# Patient Record
Sex: Female | Born: 1965
Health system: Southern US, Community
[De-identification: ages and names within clinical notes are randomized; demographics above are authoritative.]

## PROBLEM LIST (undated history)

## (undated) DIAGNOSIS — Z789 Other specified health status: Secondary | ICD-10-CM

## (undated) DIAGNOSIS — N3289 Other specified disorders of bladder: Secondary | ICD-10-CM

## (undated) HISTORY — PX: WISDOM TOOTH EXTRACTION: SHX21

## (undated) HISTORY — PX: TONSILLECTOMY: SUR1361

## (undated) HISTORY — PX: TOTAL ABDOMINAL HYSTERECTOMY: SHX209

---

## 1993-06-30 HISTORY — PX: TUBAL LIGATION: SHX77

## 1999-11-21 ENCOUNTER — Emergency Department (HOSPITAL_COMMUNITY): Admission: EM | Admit: 1999-11-21 | Discharge: 1999-11-21 | Payer: Self-pay | Admitting: Internal Medicine

## 2000-12-19 ENCOUNTER — Emergency Department (HOSPITAL_COMMUNITY): Admission: EM | Admit: 2000-12-19 | Discharge: 2000-12-19 | Payer: Self-pay

## 2000-12-23 ENCOUNTER — Emergency Department (HOSPITAL_COMMUNITY): Admission: EM | Admit: 2000-12-23 | Discharge: 2000-12-23 | Payer: Self-pay | Admitting: Emergency Medicine

## 2000-12-23 ENCOUNTER — Encounter: Payer: Self-pay | Admitting: Emergency Medicine

## 2001-09-03 ENCOUNTER — Encounter: Payer: Self-pay | Admitting: Internal Medicine

## 2001-09-03 ENCOUNTER — Encounter: Admission: RE | Admit: 2001-09-03 | Discharge: 2001-09-03 | Payer: Self-pay | Admitting: Internal Medicine

## 2002-08-29 ENCOUNTER — Inpatient Hospital Stay (HOSPITAL_COMMUNITY): Admission: AD | Admit: 2002-08-29 | Discharge: 2002-08-29 | Payer: Self-pay | Admitting: Obstetrics & Gynecology

## 2002-09-08 ENCOUNTER — Encounter: Payer: Self-pay | Admitting: Obstetrics & Gynecology

## 2002-09-08 ENCOUNTER — Ambulatory Visit (HOSPITAL_COMMUNITY): Admission: RE | Admit: 2002-09-08 | Discharge: 2002-09-08 | Payer: Self-pay | Admitting: Obstetrics & Gynecology

## 2003-08-30 ENCOUNTER — Ambulatory Visit (HOSPITAL_COMMUNITY): Admission: RE | Admit: 2003-08-30 | Discharge: 2003-08-30 | Payer: Self-pay | Admitting: Gastroenterology

## 2003-09-12 ENCOUNTER — Ambulatory Visit (HOSPITAL_COMMUNITY): Admission: RE | Admit: 2003-09-12 | Discharge: 2003-09-12 | Payer: Self-pay | Admitting: Internal Medicine

## 2003-11-08 ENCOUNTER — Emergency Department (HOSPITAL_COMMUNITY): Admission: EM | Admit: 2003-11-08 | Discharge: 2003-11-08 | Payer: Self-pay | Admitting: Emergency Medicine

## 2004-01-06 ENCOUNTER — Inpatient Hospital Stay (HOSPITAL_COMMUNITY): Admission: AD | Admit: 2004-01-06 | Discharge: 2004-01-06 | Payer: Self-pay | Admitting: Gynecology

## 2004-10-16 ENCOUNTER — Inpatient Hospital Stay (HOSPITAL_COMMUNITY): Admission: AD | Admit: 2004-10-16 | Discharge: 2004-10-16 | Payer: Self-pay | Admitting: Family Medicine

## 2005-08-13 ENCOUNTER — Encounter: Admission: RE | Admit: 2005-08-13 | Discharge: 2005-08-13 | Payer: Self-pay | Admitting: Internal Medicine

## 2005-09-29 ENCOUNTER — Ambulatory Visit (HOSPITAL_COMMUNITY): Admission: RE | Admit: 2005-09-29 | Discharge: 2005-09-29 | Payer: Self-pay | Admitting: Obstetrics & Gynecology

## 2005-10-07 ENCOUNTER — Ambulatory Visit (HOSPITAL_COMMUNITY): Admission: RE | Admit: 2005-10-07 | Discharge: 2005-10-07 | Payer: Self-pay | Admitting: Obstetrics & Gynecology

## 2006-07-17 ENCOUNTER — Ambulatory Visit (HOSPITAL_COMMUNITY): Admission: RE | Admit: 2006-07-17 | Discharge: 2006-07-17 | Payer: Self-pay | Admitting: Obstetrics & Gynecology

## 2006-07-17 ENCOUNTER — Encounter (INDEPENDENT_AMBULATORY_CARE_PROVIDER_SITE_OTHER): Payer: Self-pay | Admitting: Specialist

## 2006-07-17 HISTORY — PX: HYSTEROSCOPY W/ ENDOMETRIAL ABLATION: SUR665

## 2007-01-28 ENCOUNTER — Encounter: Admission: RE | Admit: 2007-01-28 | Discharge: 2007-01-28 | Payer: Self-pay | Admitting: Obstetrics & Gynecology

## 2008-01-31 ENCOUNTER — Ambulatory Visit (HOSPITAL_COMMUNITY): Admission: RE | Admit: 2008-01-31 | Discharge: 2008-01-31 | Payer: Self-pay | Admitting: Obstetrics & Gynecology

## 2008-02-02 ENCOUNTER — Encounter: Admission: RE | Admit: 2008-02-02 | Discharge: 2008-02-02 | Payer: Self-pay | Admitting: Obstetrics & Gynecology

## 2009-02-26 ENCOUNTER — Encounter: Admission: RE | Admit: 2009-02-26 | Discharge: 2009-02-26 | Payer: Self-pay | Admitting: Obstetrics & Gynecology

## 2010-05-03 ENCOUNTER — Encounter: Admission: RE | Admit: 2010-05-03 | Discharge: 2010-05-03 | Payer: Self-pay | Admitting: Obstetrics & Gynecology

## 2010-07-21 ENCOUNTER — Encounter: Payer: Self-pay | Admitting: Obstetrics & Gynecology

## 2010-07-21 ENCOUNTER — Encounter: Payer: Self-pay | Admitting: Internal Medicine

## 2010-11-15 NOTE — Op Note (Signed)
NAMESHABREKA, COULON NO.:  [id number]   MEDICAL RECORD NO.:  [id number]          PATIENT TYPE:  AMB   LOCATION:  SDC                           FACILITY:  [facility]   PHYSICIAN:  Roseanna Rainbow, M.D.DATE OF BIRTH:  09-05-1965   DATE OF PROCEDURE:  07/17/2006  DATE OF DISCHARGE:                               OPERATIVE REPORT   PREOPERATIVE DIAGNOSIS:  Abnormal uterine bleeding.   POSTOPERATIVE DIAGNOSIS:  Abnormal uterine bleeding.   PROCEDURE:  Dilatation and curettage hysteroscopy, diagnostic NovaSure  ablation.   SURGEON:  Roseanna Rainbow, M.D.   ANESTHESIA:  Laryngeal mask airway.   PATHOLOGY:  Endometrial curettings.   ESTIMATED BLOOD LOSS:  Minimal.   COMPLICATIONS:  None.   PROCEDURE:  The patient was taken to the operating room with an IV  running.  She was placed in the dorsal lithotomy position.  Laryngeal  mask airway was placed and she was prepped and draped in the usual  sterile fashion.  A weighted speculum and Sims retractor were then  placed into the vagina.  The antral lip of the cervix was grasped with a  single-tooth tenaculum.  The uterus was sounded to 7 cm.  Tapered Hegar  dilator was used to assess the cervical length and this was 3 cm.  The  cervix was then dilated with [facility] dilators.  A diagnostic hysteroscopic  procedure was then performed.  Upon inspection of the endometrial  cavity, there were no discrete lesions noted.  The posterior wall was  noted to have somewhat polypoid type endometrium.  Hysteroscope was then  removed.  The NovaSure device was then advanced into the uterus.  The  device was then seated.  The intracavitary CO2 integrity test was then  performed and passed.  The ablative cycle was then initiated and  completed.  Device was then removed from the uterus.  A single-tooth  tenaculum was removed from the anterior lip of the cervix with minimal  bleeding noted.  At the close of the procedure, the  instrument count was  said to be correct x2.  The patient was taken to the PACU awake and in  stable condition.      Roseanna Rainbow, M.D.  Electronically Signed     LAJ/MEDQ  D:  07/17/2006  T:  07/17/2006  Job:  [id number]

## 2010-11-15 NOTE — Op Note (Signed)
NAME:  Helen Walker, Helen Walker NO.:  [id number]   MEDICAL RECORD NO.:  [id number]                   PATIENT TYPE:  AMB   LOCATION:  ENDO                                 FACILITY:  [facility]   PHYSICIAN:  Graylin Shiver, M.D.                DATE OF BIRTH:  1966-04-19   DATE OF PROCEDURE:  08/30/2003  DATE OF DISCHARGE:                                 OPERATIVE REPORT   PROCEDURE PERFORMED:  Colonoscopy.   INDICATIONS FOR PROCEDURE:  Chronic constipation, rule out colon lesion.   Informed consent was obtained after explanation of the risks of bleeding,  infection, and perforation.   PREMEDICATIONS:  Fentanyl 60 mcg  IV, Versed 6 mg IV.   DESCRIPTION OF PROCEDURE:  With the patient in the left lateral decubitus  position, a rectal exam was performed and no masses were felt.  The Olympus  colonoscope was inserted into the rectum and advanced around the colon to  the cecum.  Cecal landmarks were identified.  The cecum and ascending colon  were normal.  The transverse colon normal.  The descending colon, sigmoid  and rectum were normal.  The patient tolerated the procedure well without  complications.   IMPRESSION:  Normal colonoscopy to the cecum.   I am going to give this patient a therapeutic trial of Zelnorm 6 mg twice  daily to see if it helps with her constipation.                                               Graylin Shiver, M.D.    Germain Osgood  D:  08/30/2003  T:  08/30/2003  Job:  [id number]   cc:   Fleet Contras, M.D.  [address]  [place]  [place] [address]  Fax: [phone]

## 2010-11-15 NOTE — H&P (Signed)
Helen Walker, Helen Walker NO.:  [id number]   MEDICAL RECORD NO.:  [id number]          PATIENT TYPE:  AMB   LOCATION:  SDC                           [facility]ACILITY:  [facility]   PHYSICIAN:  Roseanna Rainbow, M.D.DATE OF BIRTH:  Jul 02, 1965   DATE OF ADMISSION:  DATE OF DISCHARGE:                              HISTORY & PHYSICAL   CHIEF COMPLAINT:  The patient is a 45 year old with abnormal uterine  bleeding who presents for D&C and NovaSure endometrial ablation.   HISTORY OF PRESENT ILLNESS:  Attempts have been made to manage the  bleeding using Lybrel, and subsequent to this, with Depo-Provera.  Workup to date has included a pelvic ultrasound from May of 2007 that  was suspect for a possible polyp.  A hemoglobin on May 06, 2006 was  12.2.  A PT, PTT, and von Willebrand studies were all normal in October  of 2007.  A TSH, FSH, and prolactin were normal in March of 2007.   PAST MEDICAL HISTORY:  No significant history of medical diseases.   PAST SURGICAL HISTORY:  She denies.   PAST OBSTETRICAL HISTORY:  She has been pregnant four times.  She has  two living children.  There is a history of an ectopic pregnancy.  She  has had two previous cesarean deliveries.   SOCIAL HISTORY:  She denies any tobacco, ethanol, or drug use.   FAMILY HISTORY:  Adult-onset diabetes, hypertension.   MEDICATIONS:  Please see the medication reconciliation sheet.   PHYSICAL EXAM:  VITAL SIGNS:  Stable.  Afebrile.  GENERAL:  Well developed, well nourished, in no apparent distress.  LUNGS:  Clear to auscultation bilaterally.  HEART:  Regular rate and rhythm.  ABDOMEN:  Soft, nontender.  No organomegaly.  PELVIC EXAM:  Normal EG.  The speculum exam of vagina is clean.  Bimanual exam:  The uterus is small, anteverted, nontender.  Adnexa are  without masses, nontender.   ASSESSMENT:  Abnormal uterine bleeding, refractory to attempts at  medical management.   PLAN:  The planned procedures  are D&C, hysteroscopy, and NovaSure  endometrial ablation.  The risks, benefits, and alternative forms of  management were reviewed with the patient, and informed consent had been  obtained.      Roseanna Rainbow, M.D.  Electronically Signed     LAJ/MEDQ  D:  07/16/2006  T:  07/16/2006  Job:  [id number]

## 2011-04-08 ENCOUNTER — Other Ambulatory Visit: Payer: Self-pay | Admitting: Obstetrics & Gynecology

## 2011-04-08 DIAGNOSIS — Z1231 Encounter for screening mammogram for malignant neoplasm of breast: Secondary | ICD-10-CM

## 2011-05-05 ENCOUNTER — Ambulatory Visit (HOSPITAL_COMMUNITY): Payer: Self-pay | Attending: Obstetrics & Gynecology

## 2011-07-01 DIAGNOSIS — N3289 Other specified disorders of bladder: Secondary | ICD-10-CM

## 2011-07-01 HISTORY — DX: Other specified disorders of bladder: N32.89

## 2011-07-07 ENCOUNTER — Other Ambulatory Visit: Payer: Self-pay | Admitting: Urology

## 2011-07-09 ENCOUNTER — Encounter (HOSPITAL_BASED_OUTPATIENT_CLINIC_OR_DEPARTMENT_OTHER): Payer: Self-pay | Admitting: *Deleted

## 2011-07-09 NOTE — Progress Notes (Signed)
NPO AFTER MN. NEEDS HG.

## 2011-07-10 NOTE — H&P (Signed)
History of Present Illness  Ms Helen Walker is seen at Dr Annamaria Helling request.  She has a history of recurrent urinary tract infections for the past 2 years.  She denies history of UTI as a child or adolescent.  She has frequency, urgency, nocturia x 1 and pelvic pain. Renal ultrasound is unremarkable.  Cystoscopy shows a bladder mass that does not have the gross characteristics of a papillary tumor.  Surgical History Problems  1. History of  Cesarean Section  Current Meds 1. No Reported Medications  Allergies Medication  1. No Known Drug Allergies  Family History Problems  1. Maternal history of  Diabetes Mellitus V18.0 2. Family history of  Family Health Status - Father's Age 36. Family history of  Family Health Status - Mother's Age 3. Family history of  Family Health Status Number Of Children 5. Family history of  Heart Disease V17.49 6. Maternal history of  Hypertension V17.49  Social History Problems    Alcohol Use   Marital History - Single   Never A Smoker   Occupation: Denied    History of  Caffeine Use  Review of Systems Genitourinary, constitutional, skin, eye, otolaryngeal, hematologic/lymphatic, cardiovascular, pulmonary, endocrine, musculoskeletal, gastrointestinal, neurological and psychiatric system(s) were reviewed and pertinent findings if present are noted.  Genitourinary: nocturia and dyspareunia.    Vitals Vital Signs [Data Includes: Last 1 Day]  11Dec2012 03:37PM  BMI Calculated: 23.04 BSA Calculated: 1.82 Height: 5 ft 8 in Weight: 152 lb  Blood Pressure: 123 / 80 Heart Rate: 85 Respiration: 18  Physical Exam Constitutional: Well nourished and well developed . No acute distress.  Abdomen: The abdomen is soft and nontender. No masses are palpated. No CVA tenderness. No hernias are palpable. No hepatosplenomegaly noted.  Genitourinary: Examination of the external genitalia shows normal female external genitalia. The urethra is normal in  appearance. Vaginal exam demonstrates no abnormalities. No cystocele is identified. The cervix is is without abnormalities. The uterus is without abnormalities. The bladder is normal on palpation.  Lymphatics: The femoral and inguinal nodes are not enlarged or tender.  Skin: Normal skin turgor, no visible rash and no visible skin lesions.  Neuro/Psych:. Mood and affect are appropriate.    Results/Data Urine [Data Includes: Last 1 Day]  11Dec2012  COLOR: YELLOW  Reference Range YELLOW APPEARANCE: CLOUDY  Abnormal Reference Range CLEAR SPECIFIC GRAVITY: 1.020  Reference Range 1.005-1.030 pH: 7.0  Reference Range 5.0-8.0 GLUCOSE: NEG mg/dL Reference Range NEG BILIRUBIN: NEG  Reference Range NEG KETONE: NEG mg/dL Reference Range NEG BLOOD: TRACE  Abnormal Reference Range NEG PROTEIN: TRACE mg/dL Reference Range NEG UROBILINOGEN: 1 mg/dL Reference Range 0.9-8.1 NITRITE: NEG  Reference Range NEG LEUKOCYTE ESTERASE: NEG  Reference Range NEG SQUAMOUS EPITHELIAL/HPF: MANY  Abnormal Reference Range RARE WBC: 4-6 WBC/hpf Abnormal Reference Range <4 RBC: 0-3 RBC/hpf Reference Range <4 BACTERIA: MANY  Abnormal Reference Range RARE CRYSTALS: NONE SEEN  Reference Range NEG CASTS: NONE SEEN  Reference Range NEG  Assessment Assessed  1. Chronic Cystitis 595.2 2. Urinary Tract Infection 599.0  Plan Chronic Cystitis (595.2)  1. RENAL U/S COMPLETE  Requested for: 04Jan2013 Health Maintenance (V70.0)  2. UA With REFLEX  Done: 11Dec2012 03:15PM Urinary Tract Infection (599.0)  3. URINE CULTURE  Requested for: 11Dec2012   Urine culture.  Renal ultrasound.  Follow-up after ultrasound.  She may need to be on suppressive antibiotherapy. She needs cystoscopy and TUR biopsy of the blader mass.  Signatures  CC: Dr Antionette Char  Electronically signed by :  Su Grand, M.D.; Jun 10 2011  4:09PM

## 2011-07-11 ENCOUNTER — Ambulatory Visit (HOSPITAL_BASED_OUTPATIENT_CLINIC_OR_DEPARTMENT_OTHER)
Admission: RE | Admit: 2011-07-11 | Discharge: 2011-07-11 | Disposition: A | Discharge status: Home / Self Care | Payer: 59 | Source: Ambulatory Visit | Attending: Urology | Admitting: Urology

## 2011-07-11 ENCOUNTER — Ambulatory Visit (HOSPITAL_BASED_OUTPATIENT_CLINIC_OR_DEPARTMENT_OTHER): Payer: 59 | Admitting: Anesthesiology

## 2011-07-11 ENCOUNTER — Encounter (HOSPITAL_BASED_OUTPATIENT_CLINIC_OR_DEPARTMENT_OTHER): Payer: Self-pay | Admitting: Anesthesiology

## 2011-07-11 ENCOUNTER — Other Ambulatory Visit: Payer: Self-pay | Admitting: Urology

## 2011-07-11 ENCOUNTER — Encounter (HOSPITAL_BASED_OUTPATIENT_CLINIC_OR_DEPARTMENT_OTHER): Payer: Self-pay | Admitting: *Deleted

## 2011-07-11 ENCOUNTER — Encounter (HOSPITAL_BASED_OUTPATIENT_CLINIC_OR_DEPARTMENT_OTHER)
Admission: RE | Disposition: A | Discharge status: Home / Self Care | Payer: Self-pay | Source: Ambulatory Visit | Attending: Urology

## 2011-07-11 DIAGNOSIS — R3915 Urgency of urination: Secondary | ICD-10-CM | POA: Insufficient documentation

## 2011-07-11 DIAGNOSIS — Z87440 Personal history of urinary (tract) infections: Secondary | ICD-10-CM | POA: Insufficient documentation

## 2011-07-11 DIAGNOSIS — N3020 Other chronic cystitis without hematuria: Secondary | ICD-10-CM | POA: Insufficient documentation

## 2011-07-11 DIAGNOSIS — R351 Nocturia: Secondary | ICD-10-CM | POA: Insufficient documentation

## 2011-07-11 DIAGNOSIS — D494 Neoplasm of unspecified behavior of bladder: Secondary | ICD-10-CM | POA: Insufficient documentation

## 2011-07-11 DIAGNOSIS — R350 Frequency of micturition: Secondary | ICD-10-CM | POA: Insufficient documentation

## 2011-07-11 HISTORY — PX: CYSTOSCOPY W/ RETROGRADES: SHX1426

## 2011-07-11 HISTORY — PX: CYSTOSCOPY: SHX5120

## 2011-07-11 HISTORY — DX: Other specified disorders of bladder: N32.89

## 2011-07-11 HISTORY — PX: TRANSURETHRAL RESECTION OF BLADDER TUMOR: SHX2575

## 2011-07-11 LAB — POCT HEMOGLOBIN-HEMACUE: Hemoglobin: 13.7 g/dL (ref 12.0–15.0)

## 2011-07-11 SURGERY — CYSTOSCOPY
Anesthesia: General | Site: Bladder | Wound class: Clean Contaminated

## 2011-07-11 MED ORDER — HYDROCODONE-ACETAMINOPHEN 5-325 MG PO TABS: 1.0000 | ORAL_TABLET | Freq: Four times a day (QID) | ORAL | Status: AC | PRN

## 2011-07-11 MED ORDER — FENTANYL CITRATE 0.05 MG/ML IJ SOLN
INTRAMUSCULAR | Status: DC | PRN
  Administered 2011-07-11: 50 ug via INTRAVENOUS
  Administered 2011-07-11: 75 ug via INTRAVENOUS
  Administered 2011-07-11: 25 ug via INTRAVENOUS
  Administered 2011-07-11: 50 ug via INTRAVENOUS

## 2011-07-11 MED ORDER — PROPOFOL 10 MG/ML IV EMUL
INTRAVENOUS | Status: DC | PRN
  Administered 2011-07-11: 200 mg via INTRAVENOUS

## 2011-07-11 MED ORDER — MIDAZOLAM HCL 5 MG/5ML IJ SOLN
INTRAMUSCULAR | Status: DC | PRN
  Administered 2011-07-11: 2 mg via INTRAVENOUS

## 2011-07-11 MED ORDER — SODIUM CHLORIDE 0.9 % IR SOLN
Status: DC | PRN
  Administered 2011-07-11: 12000 mL

## 2011-07-11 MED ORDER — CEFAZOLIN SODIUM 1-5 GM-% IV SOLN
1.0000 g | INTRAVENOUS | Status: AC
  Administered 2011-07-11: 1 g via INTRAVENOUS

## 2011-07-11 MED ORDER — LIDOCAINE HCL (CARDIAC) 20 MG/ML IV SOLN
INTRAVENOUS | Status: DC | PRN
  Administered 2011-07-11: 60 mg via INTRAVENOUS

## 2011-07-11 MED ORDER — CEPHALEXIN 250 MG PO CAPS: 250.0000 mg | ORAL_CAPSULE | Freq: Four times a day (QID) | ORAL | Status: AC

## 2011-07-11 MED ORDER — ONDANSETRON HCL 4 MG/2ML IJ SOLN
INTRAMUSCULAR | Status: DC | PRN
  Administered 2011-07-11: 4 mg via INTRAVENOUS

## 2011-07-11 MED ORDER — FENTANYL CITRATE 0.05 MG/ML IJ SOLN: 25.0000 ug | INTRAMUSCULAR | Status: DC | PRN

## 2011-07-11 MED ORDER — KETOROLAC TROMETHAMINE 30 MG/ML IJ SOLN: 15.0000 mg | Freq: Once | INTRAMUSCULAR | Status: DC | PRN

## 2011-07-11 MED ORDER — IOHEXOL 350 MG/ML SOLN
INTRAVENOUS | Status: DC | PRN
  Administered 2011-07-11: 250 mL via INTRAVENOUS

## 2011-07-11 MED ORDER — DEXAMETHASONE SODIUM PHOSPHATE 4 MG/ML IJ SOLN
INTRAMUSCULAR | Status: DC | PRN
  Administered 2011-07-11: 8 mg via INTRAVENOUS

## 2011-07-11 MED ORDER — LACTATED RINGERS IV SOLN
INTRAVENOUS | Status: DC
  Administered 2011-07-11: 100 mL/h via INTRAVENOUS
  Administered 2011-07-11: 11:00:00 via INTRAVENOUS

## 2011-07-11 SURGICAL SUPPLY — 34 items
BAG DRAIN URO-CYSTO SKYTR STRL (DRAIN) ×3 IMPLANT
BAG URINE DRAINAGE (UROLOGICAL SUPPLIES) ×3 IMPLANT
BAG URINE LEG 19OZ MD ST LTX (BAG) IMPLANT
CANISTER SUCT LVC 12 LTR MEDI- (MISCELLANEOUS) ×6 IMPLANT
CATH FOLEY 2WAY SLVR  5CC 18FR (CATHETERS) ×1
CATH FOLEY 2WAY SLVR  5CC 20FR (CATHETERS)
CATH FOLEY 2WAY SLVR  5CC 22FR (CATHETERS)
CATH FOLEY 2WAY SLVR 5CC 18FR (CATHETERS) ×2 IMPLANT
CATH FOLEY 2WAY SLVR 5CC 20FR (CATHETERS) IMPLANT
CATH FOLEY 2WAY SLVR 5CC 22FR (CATHETERS) IMPLANT
CLOTH BEACON ORANGE TIMEOUT ST (SAFETY) ×3 IMPLANT
DRAPE CAMERA CLOSED 9X96 (DRAPES) ×3 IMPLANT
ELECT LOOP HF 26F 30D .35MM (CUTTING LOOP) IMPLANT
ELECT REM PT RETURN 9FT ADLT (ELECTROSURGICAL) ×3
ELECTRODE REM PT RTRN 9FT ADLT (ELECTROSURGICAL) ×2 IMPLANT
EVACUATOR MICROVAS BLADDER (UROLOGICAL SUPPLIES) IMPLANT
GLOVE BIO SURGEON STRL SZ7 (GLOVE) ×3 IMPLANT
GLOVE ECLIPSE 6.0 STRL STRAW (GLOVE) ×3 IMPLANT
GLOVE INDICATOR 6.5 STRL GRN (GLOVE) ×3 IMPLANT
GLOVE INDICATOR 7.0 STRL GRN (GLOVE) ×6 IMPLANT
HOLDER FOLEY CATH W/STRAP (MISCELLANEOUS) IMPLANT
IV NS IRRIG 3000ML ARTHROMATIC (IV SOLUTION) ×12 IMPLANT
KIT ASPIRATION TUBING (SET/KITS/TRAYS/PACK) ×3 IMPLANT
LOOP CUTTING 24FR OLYMPUS (CUTTING LOOP) ×3 IMPLANT
NDL SAFETY ECLIPSE 18X1.5 (NEEDLE) IMPLANT
NEEDLE HYPO 18GX1.5 SHARP (NEEDLE)
NEEDLE HYPO 22GX1.5 SAFETY (NEEDLE) IMPLANT
NS IRRIG 500ML POUR BTL (IV SOLUTION) ×3 IMPLANT
PACK CYSTOSCOPY (CUSTOM PROCEDURE TRAY) ×3 IMPLANT
PLUG CATH AND CAP STER (CATHETERS) IMPLANT
SYR 20CC LL (SYRINGE) IMPLANT
SYR 50ML LL SCALE MARK (SYRINGE) ×3 IMPLANT
SYRINGE IRR TOOMEY STRL 70CC (SYRINGE) ×3 IMPLANT
WATER STERILE IRR 3000ML UROMA (IV SOLUTION) IMPLANT

## 2011-07-11 NOTE — Anesthesia Procedure Notes (Signed)
Procedure Name: LMA Insertion Date/Time: 07/11/2011 10:26 AM Performed by: Huel Coventry Pre-anesthesia Checklist: Patient identified, Emergency Drugs available, Suction available and Patient being monitored Patient Re-evaluated:Patient Re-evaluated prior to inductionOxygen Delivery Method: Circle System Utilized Preoxygenation: Pre-oxygenation with 100% oxygen Intubation Type: IV induction Ventilation: Mask ventilation without difficulty LMA: LMA inserted LMA Size: 4.0 and 3.0 Number of attempts: 1 Airway Equipment and Method: bite block Placement Confirmation: positive ETCO2 Tube secured with: Tape Dental Injury: Teeth and Oropharynx as per pre-operative assessment

## 2011-07-11 NOTE — Transfer of Care (Signed)
Immediate Anesthesia Transfer of Care Note  Patient: KARYSS FRESE  Immediate Anesthesia Transfer of Care Note  Patient: Pascal Lux  Procedure(s) Performed:  CYSTOSCOPY - TUR Bladder Mass; TRANSURETHRAL RESECTION OF BLADDER TUMOR (TURBT); CYSTOSCOPY WITH RETROGRADE PYELOGRAM - retrograde of bladder  Patient Location: PACU  Anesthesia Type: General  Level of Consciousness: awake, alert  and oriented  Airway & Oxygen Therapy: Patient Spontanous Breathing and Patient connected to face mask oxygen  Post-op Assessment: Report given to PACU RN and Post -op Vital signs reviewed and stable  Post vital signs: Reviewed and stable  Complications: No apparent anesthesia complications   Post vital signs: Filed Vitals:   07/11/11 1135  BP:   Pulse: 99  Temp:   Resp: 20

## 2011-07-11 NOTE — Anesthesia Postprocedure Evaluation (Signed)
  Anesthesia Post-op Note  Patient: Helen Walker  Procedure(s) Performed:  CYSTOSCOPY - TUR Bladder Mass; TRANSURETHRAL RESECTION OF BLADDER TUMOR (TURBT); CYSTOSCOPY WITH RETROGRADE PYELOGRAM - retrograde of bladder  Patient Location: PACU  Anesthesia Type: General  Level of Consciousness: awake and alert   Airway and Oxygen Therapy: Patient Spontanous Breathing  Post-op Pain: mild  Post-op Assessment: Post-op Vital signs reviewed, Patient's Cardiovascular Status Stable, Respiratory Function Stable, Patent Airway and No signs of Nausea or vomiting  Post-op Vital Signs: stable  Complications: No apparent anesthesia complications

## 2011-07-11 NOTE — Op Note (Signed)
Helen Walker is a 46 y.o.   07/11/2011  Anesthesia: General  Pre-Op Diagnosis:  Bladder mass   Post-Op Diagnosis:  Same  Procedure: Cystoscopy, TUR bladder mass, cystogram.  Surgeon: Wendie Simmer. Vaida Kerchner  Indication: Patient is a 46 years old female who was seen for recurrent urinary tract infection. Renal ultrasound showed normal kidneys. Cystoscopy showed a bladder mass that does not have the characteristics of a papillary tumor. The tumor measures about 4 cm, is round and well demarcated. It is on the right side of the bladder about about 2 cm above the right ureteral orifice. Patient is scheduled today for cystoscopy and TUR of the bladder mass.  Procedure: The patient was identified by her wrist band and proper timeout was taken.  Under general anesthesia she was prepped and draped and placed in the dorsolithotomy position. A panendoscope was inserted in the bladder. There is a bladder mass above the right ureteral orifice that does not appear to be a papillary tumor. The mass is well-circumscribed. There is no other bladder lesion. The ureteral orifices are in normal position and shape.  The cystoscope was removed and a gyrus resectoscope was inserted in the bladder. The bladder mass was carefully resected, care being taken not to injure the bladder wall. The entire bladder mass was resected. Hemostasis was secured with electrocautery. There was no evidence of bleeding at the end of the procedure. The resected chips were then irrigated out of the bladder with a Toomey syringe. The resectoscope was then removed.  A #18 French Foley catheter was then inserted in the bladder. About 120 cc of Omnipaque were then instilled in the bladder. There was no evidence of extravasation of contrast.  The Foley catheter was then left to straight drainage.  The patient tolerated the procedure well and left the OR in satisfactory condition to postanesthesia care unit.  The specimen was then sent to  pathology for definitive diagnosis.  Then bimanual examination was done. There is no evidence of pelvic mass. The bladder is not attached to the surrounding structures. The uterus is firm and in the midline.  EBL: Minimal

## 2011-07-11 NOTE — Anesthesia Preprocedure Evaluation (Signed)
Anesthesia Evaluation  Patient identified by MRN, date of birth, ID band Patient awake    Reviewed: Allergy & Precautions, H&P , NPO status , Patient's Chart, lab work & pertinent test results  Airway Mallampati: II TM Distance: >3 FB Neck ROM: Full    Dental No notable dental hx.    Pulmonary neg pulmonary ROS,  clear to auscultation  Pulmonary exam normal       Cardiovascular neg cardio ROS Regular Normal    Neuro/Psych Negative Neurological ROS  Negative Psych ROS   GI/Hepatic negative GI ROS, Neg liver ROS,   Endo/Other  Negative Endocrine ROS  Renal/GU negative Renal ROS  Genitourinary negative   Musculoskeletal negative musculoskeletal ROS (+)   Abdominal   Peds negative pediatric ROS (+)  Hematology negative hematology ROS (+)   Anesthesia Other Findings   Reproductive/Obstetrics negative OB ROS                           Anesthesia Physical Anesthesia Plan  ASA: I  Anesthesia Plan: General   Post-op Pain Management:    Induction: Intravenous  Airway Management Planned: LMA  Additional Equipment:   Intra-op Plan:   Post-operative Plan:   Informed Consent: I have reviewed the patients History and Physical, chart, labs and discussed the procedure including the risks, benefits and alternatives for the proposed anesthesia with the patient or authorized representative who has indicated his/her understanding and acceptance.   Dental advisory given  Plan Discussed with: CRNA  Anesthesia Plan Comments:         Anesthesia Quick Evaluation  

## 2011-07-14 ENCOUNTER — Encounter (HOSPITAL_BASED_OUTPATIENT_CLINIC_OR_DEPARTMENT_OTHER): Payer: Self-pay | Admitting: Urology

## 2011-07-14 ENCOUNTER — Ambulatory Visit: Payer: Self-pay

## 2011-07-21 ENCOUNTER — Ambulatory Visit: Payer: Self-pay

## 2011-07-28 ENCOUNTER — Ambulatory Visit
Admission: RE | Admit: 2011-07-28 | Discharge: 2011-07-28 | Disposition: A | Discharge status: Home / Self Care | Payer: 59 | Source: Ambulatory Visit | Attending: Obstetrics & Gynecology | Admitting: Obstetrics & Gynecology

## 2011-07-28 DIAGNOSIS — Z1231 Encounter for screening mammogram for malignant neoplasm of breast: Secondary | ICD-10-CM

## 2012-03-16 ENCOUNTER — Emergency Department (HOSPITAL_COMMUNITY): Payer: 59

## 2012-03-16 ENCOUNTER — Emergency Department (HOSPITAL_COMMUNITY)
Admission: EM | Admit: 2012-03-16 | Discharge: 2012-03-16 | Disposition: A | Discharge status: Home / Self Care | Payer: 59 | Attending: Emergency Medicine | Admitting: Emergency Medicine

## 2012-03-16 DIAGNOSIS — R071 Chest pain on breathing: Secondary | ICD-10-CM | POA: Insufficient documentation

## 2012-03-16 DIAGNOSIS — R0781 Pleurodynia: Secondary | ICD-10-CM

## 2012-03-16 LAB — CBC WITH DIFFERENTIAL/PLATELET
Basophils Absolute: 0.1 10*3/uL (ref 0.0–0.1)
Basophils Relative: 1 % (ref 0–1)
Eosinophils Absolute: 0.1 10*3/uL (ref 0.0–0.7)
Eosinophils Relative: 1 % (ref 0–5)
HCT: 37.6 % (ref 36.0–46.0)
Hemoglobin: 13.1 g/dL (ref 12.0–15.0)
Lymphocytes Relative: 42 % (ref 12–46)
Lymphs Abs: 2.6 10*3/uL (ref 0.7–4.0)
MCH: 30.5 pg (ref 26.0–34.0)
MCHC: 34.8 g/dL (ref 30.0–36.0)
MCV: 87.4 fL (ref 78.0–100.0)
Monocytes Absolute: 0.5 10*3/uL (ref 0.1–1.0)
Monocytes Relative: 8 % (ref 3–12)
Neutro Abs: 3 10*3/uL (ref 1.7–7.7)
Neutrophils Relative %: 49 % (ref 43–77)
Platelets: 307 10*3/uL (ref 150–400)
RBC: 4.3 MIL/uL (ref 3.87–5.11)
RDW: 12.9 % (ref 11.5–15.5)
WBC: 6.2 10*3/uL (ref 4.0–10.5)

## 2012-03-16 LAB — BASIC METABOLIC PANEL
BUN: 11 mg/dL (ref 6–23)
CO2: 25 mEq/L (ref 19–32)
Calcium: 9.2 mg/dL (ref 8.4–10.5)
Chloride: 103 mEq/L (ref 96–112)
Creatinine, Ser: 0.8 mg/dL (ref 0.50–1.10)
GFR calc Af Amer: >90 mL/min (ref >90)
GFR calc non Af Amer: 87 mL/min — ABNORMAL LOW (ref >90)
Glucose, Bld: 92 mg/dL (ref 70–99)
Potassium: 4 mEq/L (ref 3.5–5.1)
Sodium: 136 mEq/L (ref 135–145)

## 2012-03-16 LAB — D-DIMER, QUANTITATIVE: D-Dimer, Quant: <0.27 ug/mL-FEU (ref 0.00–0.48)

## 2012-03-16 LAB — TROPONIN I: Troponin I: <0.3 ng/mL (ref <0.30)

## 2012-03-16 MED ORDER — KETOROLAC TROMETHAMINE 30 MG/ML IJ SOLN
30.0000 mg | Freq: Once | INTRAMUSCULAR | Status: AC
  Administered 2012-03-16: 30 mg via INTRAVENOUS
  Filled 2012-03-16: qty 1

## 2012-03-16 MED ORDER — ASPIRIN 81 MG PO CHEW
324.0000 mg | CHEWABLE_TABLET | Freq: Once | ORAL | Status: AC
  Administered 2012-03-16: 324 mg via ORAL
  Filled 2012-03-16: qty 4

## 2012-03-16 NOTE — ED Notes (Signed)
Pt back from x-ray. Pt hooked back up to monitor. Nad noted.

## 2012-03-16 NOTE — ED Notes (Signed)
Pt reports she woke up this morning with sharp pain in her chest that increase with deep breathing

## 2012-03-16 NOTE — ED Provider Notes (Signed)
History     CSN: [id number]  Arrival date & time 03/16/12  4098   First MD Initiated Contact with Patient 03/16/12 0732      Chief Complaint  Patient presents with  . Chest Pain    (Consider location/radiation/quality/duration/timing/severity/associated sxs/prior treatment) Patient is a 46 y.o. female presenting with chest pain. The history is provided by the patient.  Chest Pain   She was awakened one hour ago with sharp left anterior chest pain. Pain did not radiate. It was severe and she rated it at 10/10. Pain has improved and is now down to 1/10. Pain was worse with taking a deep breath but not worse with movement. There is no associated dyspnea, nausea, diaphoresis. She had a mild cough yesterday which was nonproductive. She's not had any fever or chills. Cardiac risk factors: Her mother had coronary disease with onset at age 4, but she has no history of diabetes or hypertension or hyperlipidemia and she is a nonsmoker.  Past Medical History  Diagnosis Date  . Bladder mass     Past Surgical History  Procedure Date  . Cesarean section 1990  &  1993  . Tubal ligation 1995  . Hysteroscopy w/ endometrial ablation 07-17-2006  . Cystoscopy 07/11/2011    Procedure: CYSTOSCOPY;  Surgeon: Lindaann Slough, MD;  Location: [facility];  Service: Urology;  Laterality: N/A;  TUR Bladder Mass  . Transurethral resection of bladder tumor 07/11/2011    Procedure: TRANSURETHRAL RESECTION OF BLADDER TUMOR (TURBT);  Surgeon: Lindaann Slough, MD;  Location: [facility];  Service: Urology;  Laterality: N/A;  . Cystoscopy w/ retrogrades 07/11/2011    Procedure: CYSTOSCOPY WITH RETROGRADE PYELOGRAM;  Surgeon: Lindaann Slough, MD;  Location: [facility];  Service: Urology;  Laterality: Bilateral;  retrograde of bladder    No family history on file.  History  Substance Use Topics  . Smoking status: Never Smoker   . Smokeless tobacco: Never Used  .  Alcohol Use:      OCCASIONAL    OB History    Grav Para Term Preterm Abortions TAB SAB Ect Mult Living                  Review of Systems  Cardiovascular: Positive for chest pain.  All other systems reviewed and are negative.    Allergies  Review of patient's allergies indicates no known allergies.  Home Medications  No current outpatient prescriptions on file.  BP 122/78  Pulse 89  Temp 98.2 F (36.8 C) (Oral)  Resp 18  Ht 5\' 8"  (1.727 m)  Wt 158 lb (71.668 kg)  BMI 24.02 kg/m2  SpO2 99%  Physical Exam  Nursing note and vitals reviewed. 46 year old female, resting comfortably and in no acute distress. Vital signs are normal. Oxygen saturation is 99%, which is normal. Head is normocephalic and atraumatic. PERRLA, EOMI. Oropharynx is clear. Neck is nontender and supple without adenopathy or JVD. Back is nontender and there is no CVA tenderness. Lungs are clear without rales, wheezes, or rhonchi. Chest has very mild tenderness of the left anterior chest wall. Heart has regular rate and rhythm without murmur. Abdomen is soft, flat, nontender without masses or hepatosplenomegaly and peristalsis is normoactive. Extremities have no cyanosis or edema, full range of motion is present. Skin is warm and dry without rash. Neurologic: Mental status is normal, cranial nerves are intact, there are no motor or sensory deficits.   ED Course  Procedures (including critical  care time)  Results for orders placed during the hospital encounter of 03/16/12  CBC WITH DIFFERENTIAL      Component Value Range   WBC 6.2  4.0 - 10.5 K/uL   RBC 4.30  3.87 - 5.11 MIL/uL   Hemoglobin 13.1  12.0 - 15.0 g/dL   HCT 16.1  09.6 - 04.5 %   MCV 87.4  78.0 - 100.0 fL   MCH 30.5  26.0 - 34.0 pg   MCHC 34.8  30.0 - 36.0 g/dL   RDW 40.9  81.1 - 91.4 %   Platelets 307  150 - 400 K/uL   Neutrophils Relative 49  43 - 77 %   Neutro Abs 3.0  1.7 - 7.7 K/uL   Lymphocytes Relative 42  12 - 46 %    Lymphs Abs 2.6  0.7 - 4.0 K/uL   Monocytes Relative 8  3 - 12 %   Monocytes Absolute 0.5  0.1 - 1.0 K/uL   Eosinophils Relative 1  0 - 5 %   Eosinophils Absolute 0.1  0.0 - 0.7 K/uL   Basophils Relative 1  0 - 1 %   Basophils Absolute 0.1  0.0 - 0.1 K/uL  BASIC METABOLIC PANEL      Component Value Range   Sodium 136  135 - 145 mEq/L   Potassium 4.0  3.5 - 5.1 mEq/L   Chloride 103  96 - 112 mEq/L   CO2 25  19 - 32 mEq/L   Glucose, Bld 92  70 - 99 mg/dL   BUN 11  6 - 23 mg/dL   Creatinine, Ser 7.82  0.50 - 1.10 mg/dL   Calcium 9.2  8.4 - 95.6 mg/dL   GFR calc non Af Amer 87 (*) >90 mL/min   GFR calc Af Amer >90  >90 mL/min  D-DIMER, QUANTITATIVE      Component Value Range   D-Dimer, Quant <0.27  0.00 - 0.48 ug/mL-FEU  TROPONIN I      Component Value Range   Troponin I <0.30  <0.30 ng/mL   Dg Chest 2 View  03/16/2012  *RADIOLOGY REPORT*  Clinical Data: Chest pain, shortness of breath  CHEST - 2 VIEW  Comparison: Chest x-ray of 11/08/2003  Findings: The lungs are clear.  Mediastinal contours are stable. The heart is mildly enlarged  and stable.  Thoracolumbar scoliosis is stable.  IMPRESSION: Stable chest x-ray with mild cardiomegaly and scoliosis.   Original Report Authenticated By: Juline Patch, M.D.        Date: 03/16/2012  Rate: 74  Rhythm: normal sinus rhythm  QRS Axis: normal  Intervals: normal  ST/T Wave abnormalities: nonspecific T wave changes  Conduction Disutrbances:none  Narrative Interpretation: Minor nonspecific T wave flattening, otherwise normal ECG. No prior ECGs available for comparison.  Old EKG Reviewed: none available    1. Pleuritic chest pain       MDM  Clinic chest pain which is resolving. She is at relatively low risk for coronary artery disease with only risk factor being family history. She will be given Toradol and aspirin and after workup has been initiated.  Laboratory workup was normal including normal d-dimer. She got complete relief of  pain with single dose of ketorolac. With pleuritic pain in a patient with no risk factors and complete relief with NSAID, I do not feel she needs any further testing. She is discharged with instructions to take ibuprofen as needed and return to the emergency department if she has  new symptoms.        Dione Booze, MD 03/16/12 [phone]

## 2012-03-16 NOTE — ED Notes (Signed)
Pt reports waking up w/ L-sided chest pain this am. Pt reports SOB w/ chest pain. Pt denies dizziness, N/V, diaphoresis. Pt states she has not taken anything for chest pain prior to arrival. Pt in NSR, A&0 x 4, nad noted. Pt reports a decrease in chest pain at this time 2/10.

## 2012-06-29 ENCOUNTER — Other Ambulatory Visit: Payer: Self-pay | Admitting: Obstetrics & Gynecology

## 2012-06-29 DIAGNOSIS — Z1231 Encounter for screening mammogram for malignant neoplasm of breast: Secondary | ICD-10-CM

## 2012-08-02 ENCOUNTER — Ambulatory Visit
Admission: RE | Admit: 2012-08-02 | Discharge: 2012-08-02 | Disposition: A | Discharge status: Home / Self Care | Payer: 59 | Source: Ambulatory Visit | Attending: Obstetrics & Gynecology | Admitting: Obstetrics & Gynecology

## 2012-08-02 DIAGNOSIS — Z1231 Encounter for screening mammogram for malignant neoplasm of breast: Secondary | ICD-10-CM

## 2012-08-04 ENCOUNTER — Other Ambulatory Visit: Payer: Self-pay | Admitting: Obstetrics & Gynecology

## 2012-08-04 DIAGNOSIS — R928 Other abnormal and inconclusive findings on diagnostic imaging of breast: Secondary | ICD-10-CM

## 2012-08-16 ENCOUNTER — Ambulatory Visit
Admission: RE | Admit: 2012-08-16 | Discharge: 2012-08-16 | Disposition: A | Discharge status: Home / Self Care | Payer: 59 | Source: Ambulatory Visit | Attending: Obstetrics & Gynecology | Admitting: Obstetrics & Gynecology

## 2012-08-16 ENCOUNTER — Other Ambulatory Visit: Payer: Self-pay | Admitting: Obstetrics & Gynecology

## 2012-08-16 DIAGNOSIS — R928 Other abnormal and inconclusive findings on diagnostic imaging of breast: Secondary | ICD-10-CM

## 2012-08-16 DIAGNOSIS — R921 Mammographic calcification found on diagnostic imaging of breast: Secondary | ICD-10-CM

## 2012-10-25 ENCOUNTER — Encounter (HOSPITAL_COMMUNITY): Payer: Self-pay | Admitting: Emergency Medicine

## 2012-10-25 ENCOUNTER — Emergency Department (HOSPITAL_COMMUNITY)
Admission: EM | Admit: 2012-10-25 | Discharge: 2012-10-25 | Disposition: A | Discharge status: Home / Self Care | Payer: 59 | Source: Home / Self Care | Attending: Emergency Medicine | Admitting: Emergency Medicine

## 2012-10-25 ENCOUNTER — Emergency Department (INDEPENDENT_AMBULATORY_CARE_PROVIDER_SITE_OTHER): Payer: 59

## 2012-10-25 DIAGNOSIS — S139XXA Sprain of joints and ligaments of unspecified parts of neck, initial encounter: Secondary | ICD-10-CM

## 2012-10-25 DIAGNOSIS — S161XXA Strain of muscle, fascia and tendon at neck level, initial encounter: Secondary | ICD-10-CM

## 2012-10-25 MED ORDER — CYCLOBENZAPRINE HCL 10 MG PO TABS: 10.0000 mg | ORAL_TABLET | Freq: Three times a day (TID) | ORAL | Status: DC | PRN

## 2012-10-25 MED ORDER — IBUPROFEN 600 MG PO TABS: 600.0000 mg | ORAL_TABLET | Freq: Four times a day (QID) | ORAL | Status: DC | PRN

## 2012-10-25 NOTE — ED Provider Notes (Signed)
History     CSN: [id number]  Arrival date & time 10/25/12  1046   First MD Initiated Contact with Patient 10/25/12 1120      Chief Complaint  Patient presents with  . Neck Pain    (Consider location/radiation/quality/duration/timing/severity/associated sxs/prior treatment) Patient is a 47 y.o. Walker presenting with neck pain. The history is provided by the patient.  Neck Pain Pain location:  Occipital region Quality:  Aching Pain radiates to:  L arm and R arm Pain severity:  Moderate Pain is:  Same all the time Onset quality:  Sudden Duration:  1 day Timing:  Constant Progression:  Unchanged Chronicity:  New Context: fall   Context comment:  Says fell 2 weeks and twisted body and neck Relieved by:  Nothing Worsened by:  Position Ineffective treatments:  None tried   Past Medical History  Diagnosis Date  . Bladder mass     Past Surgical History  Procedure Laterality Date  . Cesarean section  1990  &  1993  . Tubal ligation  1995  . Hysteroscopy w/ endometrial ablation  07-17-2006  . Cystoscopy  07/11/2011    Procedure: CYSTOSCOPY;  Surgeon: Lindaann Slough, MD;  Location: [facility];  Service: Urology;  Laterality: N/A;  TUR Bladder Mass  . Transurethral resection of bladder tumor  07/11/2011    Procedure: TRANSURETHRAL RESECTION OF BLADDER TUMOR (TURBT);  Surgeon: Lindaann Slough, MD;  Location: [facility];  Service: Urology;  Laterality: N/A;  . Cystoscopy w/ retrogrades  07/11/2011    Procedure: CYSTOSCOPY WITH RETROGRADE PYELOGRAM;  Surgeon: Lindaann Slough, MD;  Location: [facility];  Service: Urology;  Laterality: Bilateral;  retrograde of bladder    No family history on file.  History  Substance Use Topics  . Smoking status: Never Smoker   . Smokeless tobacco: Never Used  . Alcohol Use:      Comment: OCCASIONAL    OB History   Grav Para Term Preterm Abortions TAB SAB Ect Mult Living                   Review of Systems  HENT: Positive for neck pain.   Gastrointestinal:       Says slight pain on swallowing this am  All other systems reviewed and are negative.    Allergies  Review of patient's allergies indicates no known allergies.  Home Medications   Current Outpatient Rx  Name  Route  Sig  Dispense  Refill  . cyclobenzaprine (FLEXERIL) 10 MG tablet   Oral   Take 1 tablet (10 mg total) by mouth 3 (three) times daily as needed for muscle spasms.   20 tablet   0   . ibuprofen (ADVIL,MOTRIN) 600 MG tablet   Oral   Take 1 tablet (600 mg total) by mouth every 6 (six) hours as needed for pain.   30 tablet   0     LMP 10/11/2012  Physical Exam  Constitutional: She is oriented to person, place, and time. She appears well-developed and well-nourished.  HENT:  Head: Normocephalic and atraumatic.  Mouth/Throat: Oropharynx is clear and moist.  Eyes: Pupils are equal, round, and reactive to light.  Neck:  No swelling or deformity ; slight tender on palpation of posterior neck; ROM limited on all directions due to pain  Cardiovascular: Normal rate and regular rhythm.   Pulmonary/Chest: Effort normal and breath sounds normal.  Abdominal: Soft.  Musculoskeletal: Normal range of motion.  Neurological: She is  alert and oriented to person, place, and time. No cranial nerve deficit.  Skin: Skin is warm and dry.    ED Course  Procedures (including critical care time)  Labs Reviewed - No data to display No results found.   1. Acute strain of neck muscle, initial encounter       MDM          Jani Files, MD 10/28/12 [phone]

## 2012-10-25 NOTE — ED Notes (Signed)
Pt c/o neck pain on back onset this am when she woke up Reports that due to the pain, it hurst to swallow, having headaches and dizziness Denies: f/v/n/d, inj/trauma to site [place] for the discomfort w/no relief.   She is alert and oriented w/no signs of acute distress.

## 2012-10-28 ENCOUNTER — Encounter: Payer: Self-pay | Admitting: Obstetrics

## 2012-11-25 ENCOUNTER — Ambulatory Visit (INDEPENDENT_AMBULATORY_CARE_PROVIDER_SITE_OTHER): Payer: 59 | Admitting: Obstetrics

## 2012-11-25 ENCOUNTER — Encounter: Payer: Self-pay | Admitting: Obstetrics

## 2012-11-25 VITALS — BP 107/71 | HR 89 | Temp 98.2°F | Wt 161.0 lb

## 2012-11-25 DIAGNOSIS — A6000 Herpesviral infection of urogenital system, unspecified: Secondary | ICD-10-CM | POA: Insufficient documentation

## 2012-11-25 DIAGNOSIS — N390 Urinary tract infection, site not specified: Secondary | ICD-10-CM | POA: Insufficient documentation

## 2012-11-25 LAB — POCT URINALYSIS DIPSTICK
Blood, UA: NEGATIVE
Glucose, UA: NEGATIVE
Ketones, UA: NEGATIVE
Leukocytes, UA: NEGATIVE
Nitrite, UA: NEGATIVE
Protein, UA: NEGATIVE
Spec Grav, UA: 1.025
pH, UA: 5

## 2012-11-25 MED ORDER — VALACYCLOVIR HCL 500 MG PO TABS: ORAL_TABLET | ORAL | Status: DC

## 2012-11-25 NOTE — Patient Instructions (Addendum)
UTI

## 2012-11-25 NOTE — Progress Notes (Signed)
Subjective:     Helen Walker is a 47 y.o. female here for a routine exam.  Current complaints: possible UTI. Pt states she is having a strong odor to her urine. Pt states she is not having burning with urination. Pt states she is having some lower abdomen pain on the right side.    Personal health questionnaire reviewed: yes.   Gynecologic History Patient's last menstrual period was 11/01/2012. Contraception: tubal ligation Last Pap: 2012. Results were: normal Last mammogram: 2014. Results were: normal  Obstetric History OB History   Grav Para Term Preterm Abortions TAB SAB Ect Mult Living                   The following portions of the patient's history were reviewed and updated as appropriate: allergies, current medications, past family history, past medical history, past social history, past surgical history and problem list.  Review of Systems Pertinent items are noted in HPI.    Objective:    General appearance: alert and no distress Abdomen: normal findings: soft, non-tender Pelvic: cervix normal in appearance, external genitalia normal, no adnexal masses or tenderness, no cervical motion tenderness, uterus normal size, shape, and consistency and vagina normal without discharge    Assessment:    UTI, otherwise normal exam.   Plan:    Follow up in: several months. Suprax dispensed.  Urine culture sent.

## 2012-11-26 ENCOUNTER — Encounter: Payer: Self-pay | Admitting: Obstetrics

## 2012-11-27 LAB — URINE CULTURE
Colony Count: NO GROWTH
Organism ID, Bacteria: NO GROWTH

## 2012-12-02 ENCOUNTER — Telehealth: Payer: Self-pay | Admitting: *Deleted

## 2012-12-02 NOTE — Telephone Encounter (Signed)
Patient called regarding samples she received from her office visit - Suprax.  Patient states that it is too strong for her and causing her GI upset.  Macrobid 1 PO BID #14 called to her pharmacy per Dr. Clearance Coots.

## 2012-12-03 ENCOUNTER — Encounter: Payer: Self-pay | Admitting: Obstetrics

## 2013-02-07 ENCOUNTER — Ambulatory Visit
Admission: RE | Admit: 2013-02-07 | Discharge: 2013-02-07 | Disposition: A | Discharge status: Home / Self Care | Payer: 59 | Source: Ambulatory Visit | Attending: Obstetrics & Gynecology | Admitting: Obstetrics & Gynecology

## 2013-02-07 DIAGNOSIS — R921 Mammographic calcification found on diagnostic imaging of breast: Secondary | ICD-10-CM

## 2013-06-16 ENCOUNTER — Other Ambulatory Visit: Payer: Self-pay | Admitting: *Deleted

## 2013-06-16 DIAGNOSIS — A6000 Herpesviral infection of urogenital system, unspecified: Secondary | ICD-10-CM

## 2013-06-16 MED ORDER — VALACYCLOVIR HCL 500 MG PO TABS: ORAL_TABLET | ORAL | Status: DC

## 2013-07-12 ENCOUNTER — Other Ambulatory Visit: Payer: Self-pay | Admitting: Obstetrics & Gynecology

## 2013-07-12 DIAGNOSIS — R921 Mammographic calcification found on diagnostic imaging of breast: Secondary | ICD-10-CM

## 2013-07-13 ENCOUNTER — Emergency Department (HOSPITAL_COMMUNITY)
Admission: EM | Admit: 2013-07-13 | Discharge: 2013-07-13 | Disposition: A | Discharge status: Home / Self Care | Payer: BC Managed Care – PPO | Source: Home / Self Care

## 2013-07-13 ENCOUNTER — Encounter (HOSPITAL_COMMUNITY): Payer: Self-pay | Admitting: Emergency Medicine

## 2013-07-13 DIAGNOSIS — N390 Urinary tract infection, site not specified: Secondary | ICD-10-CM

## 2013-07-13 LAB — POCT URINALYSIS DIP (DEVICE)
Bilirubin Urine: NEGATIVE
Glucose, UA: NEGATIVE mg/dL
Hgb urine dipstick: NEGATIVE
Ketones, ur: NEGATIVE mg/dL
Leukocytes, UA: NEGATIVE
Nitrite: NEGATIVE
Protein, ur: NEGATIVE mg/dL
Specific Gravity, Urine: 1.015 (ref 1.005–1.030)
Urobilinogen, UA: 1 mg/dL (ref 0.0–1.0)
pH: 7 (ref 5.0–8.0)

## 2013-07-13 MED ORDER — CEPHALEXIN 250 MG PO CAPS: 250.0000 mg | ORAL_CAPSULE | Freq: Four times a day (QID) | ORAL | Status: DC

## 2013-07-13 NOTE — ED Notes (Signed)
C/o bladder infection/UTI States she has abd pain, odor, and urinating frequently Denies any discharge

## 2013-07-13 NOTE — Discharge Instructions (Signed)
Urinary Tract Infection  Urinary tract infections (UTIs) can develop anywhere along your urinary tract. Your urinary tract is your body's drainage system for removing wastes and extra water. Your urinary tract includes two kidneys, two ureters, a bladder, and a urethra. Your kidneys are a pair of bean-shaped organs. Each kidney is about the size of your fist. They are located below your ribs, one on each side of your spine.  CAUSES  Infections are caused by microbes, which are microscopic organisms, including fungi, viruses, and bacteria. These organisms are so small that they can only be seen through a microscope. Bacteria are the microbes that most commonly cause UTIs.  SYMPTOMS   Symptoms of UTIs may vary by age and gender of the patient and by the location of the infection. Symptoms in young women typically include a frequent and intense urge to urinate and a painful, burning feeling in the bladder or urethra during urination. Older women and men are more likely to be tired, shaky, and weak and have muscle aches and abdominal pain. A fever may mean the infection is in your kidneys. Other symptoms of a kidney infection include pain in your back or sides below the ribs, nausea, and vomiting.  DIAGNOSIS  To diagnose a UTI, your caregiver will ask you about your symptoms. Your caregiver also will ask to provide a urine sample. The urine sample will be tested for bacteria and white blood cells. White blood cells are made by your body to help fight infection.  TREATMENT   Typically, UTIs can be treated with medication. Because most UTIs are caused by a bacterial infection, they usually can be treated with the use of antibiotics. The choice of antibiotic and length of treatment depend on your symptoms and the type of bacteria causing your infection.  HOME CARE INSTRUCTIONS   If you were prescribed antibiotics, take them exactly as your caregiver instructs you. Finish the medication even if you feel better after you  have only taken some of the medication.   Drink enough water and fluids to keep your urine clear or pale yellow.   Avoid caffeine, tea, and carbonated beverages. They tend to irritate your bladder.   Empty your bladder often. Avoid holding urine for long periods of time.   Empty your bladder before and after sexual intercourse.   After a bowel movement, women should cleanse from front to back. Use each tissue only once.  SEEK MEDICAL CARE IF:    You have back pain.   You develop a fever.   Your symptoms do not begin to resolve within 3 days.  SEEK IMMEDIATE MEDICAL CARE IF:    You have severe back pain or lower abdominal pain.   You develop chills.   You have nausea or vomiting.   You have continued burning or discomfort with urination.  MAKE SURE YOU:    Understand these instructions.   Will watch your condition.   Will get help right away if you are not doing well or get worse.  Document Released: 03/26/2005 Document Revised: 12/16/2011 Document Reviewed: 07/25/2011  [employer] Patient Information 2014 [employer].

## 2013-07-13 NOTE — ED Provider Notes (Signed)
CSN: [id number]     Arrival date & time 07/13/13  30 History   First MD Initiated Contact with Patient 07/13/13 1446     Chief Complaint  Patient presents with  . Urinary Tract Infection   (Consider location/radiation/quality/duration/timing/severity/associated sxs/prior Treatment) HPI Comments: 47 year old female complaining of pain across the midabdomen, lower abdominal/pelvic pressure, urinary urgency and frequency and small volume voids. Symptoms started approximately 4 days ago. Denies vaginal discharge or GI symptoms.   Past Medical History  Diagnosis Date  . Bladder mass    Past Surgical History  Procedure Laterality Date  . Cesarean section  [place]  . Tubal ligation  1995  . Hysteroscopy w/ endometrial ablation  07-17-2006  . Cystoscopy  07/11/2011    Procedure: CYSTOSCOPY;  Surgeon: Hanley Ben, MD;  Location: [facility];  Service: Urology;  Laterality: N/A;  TUR Bladder Mass  . Transurethral resection of bladder tumor  07/11/2011    Procedure: TRANSURETHRAL RESECTION OF BLADDER TUMOR (TURBT);  Surgeon: Hanley Ben, MD;  Location: [facility];  Service: Urology;  Laterality: N/A;  . Cystoscopy w/ retrogrades  07/11/2011    Procedure: CYSTOSCOPY WITH RETROGRADE PYELOGRAM;  Surgeon: Hanley Ben, MD;  Location: [place];  Service: Urology;  Laterality: Bilateral;  retrograde of bladder   History reviewed. No pertinent family history. History  Substance Use Topics  . Smoking status: Never Smoker   . Smokeless tobacco: Never Used  . Alcohol Use: Yes     Comment: OCCASIONAL   OB History   Grav Para Term Preterm Abortions TAB SAB Ect Mult Living                 Review of Systems  Constitutional: Negative.   Respiratory: Negative.   Gastrointestinal: Positive for abdominal pain. Negative for nausea, vomiting, blood in stool and abdominal distention.  Genitourinary: Positive for urgency, frequency and  decreased urine volume. Negative for flank pain, vaginal bleeding, vaginal discharge, vaginal pain and pelvic pain.  Neurological: Negative.     Allergies  Review of patient's allergies indicates no known allergies.  Home Medications   Current Outpatient Rx  Name  Route  Sig  Dispense  Refill  . cephALEXin (KEFLEX) 250 MG capsule   Oral   Take 1 capsule (250 mg total) by mouth 4 (four) times daily.   28 capsule   0   . cyclobenzaprine (FLEXERIL) 10 MG tablet   Oral   Take 1 tablet (10 mg total) by mouth 3 (three) times daily as needed for muscle spasms.   20 tablet   0   . ibuprofen (ADVIL,MOTRIN) 600 MG tablet   Oral   Take 1 tablet (600 mg total) by mouth every 6 (six) hours as needed for pain.   30 tablet   0   . valACYclovir (VALTREX) 500 MG tablet      Take 1 tablet bid x 3 days prn.   30 tablet   prn    BP 119/79  Pulse 80  Resp 17  SpO2 96%  LMP 06/30/2013 Physical Exam  Nursing note and vitals reviewed. Constitutional: She is oriented to person, place, and time. She appears well-developed and well-nourished. No distress.  Neck: Normal range of motion. Neck supple.  Pulmonary/Chest: Effort normal. No respiratory distress.  Abdominal: Soft. She exhibits no distension and no mass. There is no rebound and no guarding.  Very minor tenderness across the mid abdomen. Not reproducible with additional palpation  exams.  Neurological: She is alert and oriented to person, place, and time.  Skin: Skin is warm and dry.  Psychiatric: She has a normal mood and affect.    ED Course  Procedures (including critical care time) Labs Review Labs Reviewed  URINE CULTURE  POCT URINALYSIS DIP (DEVICE)   Imaging Review No results found.    MDM   1. UTI (lower urinary tract infection)      Patient has typical symptoms of a UTI although the urinalysis is clear. She has no pelvic pain or tenderness or vaginal discharge. At this time we will treat empirically for UTI  with Keflex and order a urine culture.    Janne Napoleon, NP 07/13/13 [phone]

## 2013-07-14 NOTE — ED Provider Notes (Signed)
Medical screening examination/treatment/procedure(s) were performed by a resident physician or non-physician practitioner and as the supervising physician I was immediately available for consultation/collaboration.  Lynne Leader, MD    Gregor Hams, MD 07/14/13 [phone]

## 2013-07-15 ENCOUNTER — Ambulatory Visit: Payer: 59 | Admitting: Women's Health

## 2013-07-15 LAB — URINE CULTURE: Colony Count: >=100000

## 2013-07-15 NOTE — ED Notes (Signed)
Urine culture: >100,000 colonies E. Coli.  Pt. adequately treated with Keflex. Roselyn Meier 07/15/2013

## 2013-07-27 ENCOUNTER — Ambulatory Visit: Payer: 59 | Admitting: Obstetrics

## 2013-08-05 ENCOUNTER — Ambulatory Visit
Admission: RE | Admit: 2013-08-05 | Discharge: 2013-08-05 | Disposition: A | Discharge status: Home / Self Care | Payer: BC Managed Care – PPO | Source: Ambulatory Visit | Attending: Obstetrics & Gynecology | Admitting: Obstetrics & Gynecology

## 2013-08-05 DIAGNOSIS — R921 Mammographic calcification found on diagnostic imaging of breast: Secondary | ICD-10-CM

## 2013-08-08 ENCOUNTER — Ambulatory Visit: Payer: 59 | Admitting: Gynecology

## 2013-08-08 ENCOUNTER — Ambulatory Visit: Payer: 59 | Admitting: Obstetrics & Gynecology

## 2013-10-08 ENCOUNTER — Emergency Department (HOSPITAL_COMMUNITY)
Admission: EM | Admit: 2013-10-08 | Discharge: 2013-10-08 | Disposition: A | Discharge status: Home / Self Care | Payer: BC Managed Care – PPO | Source: Home / Self Care | Attending: Family Medicine | Admitting: Family Medicine

## 2013-10-08 ENCOUNTER — Encounter (HOSPITAL_COMMUNITY): Payer: Self-pay | Admitting: Emergency Medicine

## 2013-10-08 DIAGNOSIS — N390 Urinary tract infection, site not specified: Secondary | ICD-10-CM

## 2013-10-08 LAB — POCT URINALYSIS DIP (DEVICE)
Bilirubin Urine: NEGATIVE
Glucose, UA: NEGATIVE mg/dL
Hgb urine dipstick: NEGATIVE
Ketones, ur: NEGATIVE mg/dL
Nitrite: NEGATIVE
Protein, ur: NEGATIVE mg/dL
Specific Gravity, Urine: 1.01 (ref 1.005–1.030)
Urobilinogen, UA: 0.2 mg/dL (ref 0.0–1.0)
pH: 5.5 (ref 5.0–8.0)

## 2013-10-08 MED ORDER — SULFAMETHOXAZOLE-TMP DS 800-160 MG PO TABS: 1.0000 | ORAL_TABLET | Freq: Two times a day (BID) | ORAL | Status: DC

## 2013-10-08 MED ORDER — PHENAZOPYRIDINE HCL 200 MG PO TABS: 200.0000 mg | ORAL_TABLET | Freq: Three times a day (TID) | ORAL | Status: DC

## 2013-10-08 NOTE — ED Provider Notes (Signed)
Medical screening examination/treatment/procedure(s) were performed by a resident physician or non-physician practitioner and as the supervising physician I was immediately available for consultation/collaboration.  Lynne Leader, MD    Gregor Hams, MD 10/08/13 2029

## 2013-10-08 NOTE — ED Notes (Signed)
C/o feels like another UTI.  

## 2013-10-08 NOTE — Discharge Instructions (Signed)
Urinary Tract Infection  Urinary tract infections (UTIs) can develop anywhere along your urinary tract. Your urinary tract is your body's drainage system for removing wastes and extra water. Your urinary tract includes two kidneys, two ureters, a bladder, and a urethra. Your kidneys are a pair of bean-shaped organs. Each kidney is about the size of your fist. They are located below your ribs, one on each side of your spine.  CAUSES  Infections are caused by microbes, which are microscopic organisms, including fungi, viruses, and bacteria. These organisms are so small that they can only be seen through a microscope. Bacteria are the microbes that most commonly cause UTIs.  SYMPTOMS   Symptoms of UTIs may vary by age and gender of the patient and by the location of the infection. Symptoms in young women typically include a frequent and intense urge to urinate and a painful, burning feeling in the bladder or urethra during urination. Older women and men are more likely to be tired, shaky, and weak and have muscle aches and abdominal pain. A fever may mean the infection is in your kidneys. Other symptoms of a kidney infection include pain in your back or sides below the ribs, nausea, and vomiting.  DIAGNOSIS  To diagnose a UTI, your caregiver will ask you about your symptoms. Your caregiver also will ask to provide a urine sample. The urine sample will be tested for bacteria and white blood cells. White blood cells are made by your body to help fight infection.  TREATMENT   Typically, UTIs can be treated with medication. Because most UTIs are caused by a bacterial infection, they usually can be treated with the use of antibiotics. The choice of antibiotic and length of treatment depend on your symptoms and the type of bacteria causing your infection.  HOME CARE INSTRUCTIONS   If you were prescribed antibiotics, take them exactly as your caregiver instructs you. Finish the medication even if you feel better after you  have only taken some of the medication.   Drink enough water and fluids to keep your urine clear or pale yellow.   Avoid caffeine, tea, and carbonated beverages. They tend to irritate your bladder.   Empty your bladder often. Avoid holding urine for long periods of time.   Empty your bladder before and after sexual intercourse.   After a bowel movement, women should cleanse from front to back. Use each tissue only once.  SEEK MEDICAL CARE IF:    You have back pain.   You develop a fever.   Your symptoms do not begin to resolve within 3 days.  SEEK IMMEDIATE MEDICAL CARE IF:    You have severe back pain or lower abdominal pain.   You develop chills.   You have nausea or vomiting.   You have continued burning or discomfort with urination.  MAKE SURE YOU:    Understand these instructions.   Will watch your condition.   Will get help right away if you are not doing well or get worse.  Document Released: 03/26/2005 Document Revised: 12/16/2011 Document Reviewed: 07/25/2011  [employer] Patient Information 2014 [employer].

## 2013-10-08 NOTE — ED Provider Notes (Signed)
CSN: [id number]     Arrival date & time 10/08/13  1330 History   First MD Initiated Contact with Patient 10/08/13 1513     Chief Complaint  Patient presents with  . Urinary Tract Infection   (Consider location/radiation/quality/duration/timing/severity/associated sxs/prior Treatment) HPI Comments: 48 year old female presents complaining of possible UTI. For 3 days, she has abdominal cramping, urinary frequency, dysuria. This is identical symptoms with previous urinary tract infections. She denies fever, chills, NVD, flank pain. No vaginal discharge or risk for STDs.  Patient is a 48 y.o. female presenting with urinary tract infection.  Urinary Tract Infection Associated symptoms include abdominal pain.    Past Medical History  Diagnosis Date  . Bladder mass    Past Surgical History  Procedure Laterality Date  . Cesarean section  [place]  . Tubal ligation  1995  . Hysteroscopy w/ endometrial ablation  07-17-2006  . Cystoscopy  07/11/2011    Procedure: CYSTOSCOPY;  Surgeon: Hanley Ben, MD;  Location: [facility];  Service: Urology;  Laterality: N/A;  TUR Bladder Mass  . Transurethral resection of bladder tumor  07/11/2011    Procedure: TRANSURETHRAL RESECTION OF BLADDER TUMOR (TURBT);  Surgeon: Hanley Ben, MD;  Location: [facility];  Service: Urology;  Laterality: N/A;  . Cystoscopy w/ retrogrades  07/11/2011    Procedure: CYSTOSCOPY WITH RETROGRADE PYELOGRAM;  Surgeon: Hanley Ben, MD;  Location: [place];  Service: Urology;  Laterality: Bilateral;  retrograde of bladder   History reviewed. No pertinent family history. History  Substance Use Topics  . Smoking status: Never Smoker   . Smokeless tobacco: Never Used  . Alcohol Use: Yes     Comment: OCCASIONAL   OB History   Grav Para Term Preterm Abortions TAB SAB Ect Mult Living                 Review of Systems  Constitutional: Negative for fever and chills.   Gastrointestinal: Positive for abdominal pain. Negative for nausea and vomiting.  Genitourinary: Positive for dysuria, urgency and frequency. Negative for hematuria, flank pain, vaginal discharge and genital sores.  All other systems reviewed and are negative.   Allergies  Review of patient's allergies indicates no known allergies.  Home Medications   Current Outpatient Rx  Name  Route  Sig  Dispense  Refill  . cephALEXin (KEFLEX) 250 MG capsule   Oral   Take 1 capsule (250 mg total) by mouth 4 (four) times daily.   28 capsule   0   . cyclobenzaprine (FLEXERIL) 10 MG tablet   Oral   Take 1 tablet (10 mg total) by mouth 3 (three) times daily as needed for muscle spasms.   20 tablet   0   . ibuprofen (ADVIL,MOTRIN) 600 MG tablet   Oral   Take 1 tablet (600 mg total) by mouth every 6 (six) hours as needed for pain.   30 tablet   0   . phenazopyridine (PYRIDIUM) 200 MG tablet   Oral   Take 1 tablet (200 mg total) by mouth 3 (three) times daily.   6 tablet   0   . sulfamethoxazole-trimethoprim (BACTRIM DS) 800-160 MG per tablet   Oral   Take 1 tablet by mouth 2 (two) times daily.   10 tablet   0   . valACYclovir (VALTREX) 500 MG tablet      Take 1 tablet bid x 3 days prn.   30 tablet   prn  BP 127/83  Pulse 69  Temp(Src) 98.5 F (36.9 C) (Oral)  Resp 17  SpO2 100% Physical Exam  Nursing note and vitals reviewed. Constitutional: She is oriented to person, place, and time. Vital signs are normal. She appears well-developed and well-nourished. No distress.  HENT:  Head: Normocephalic and atraumatic.  Pulmonary/Chest: Effort normal. No respiratory distress.  Abdominal: Soft. Normal appearance and bowel sounds are normal. There is tenderness in the suprapubic area. There is no rigidity, no rebound, no guarding, no CVA tenderness, no tenderness at McBurney's point and negative Murphy's sign.  Neurological: She is alert and oriented to person, place, and time.  She has normal strength. Coordination normal.  Skin: Skin is warm and dry. No rash noted. She is not diaphoretic.  Psychiatric: She has a normal mood and affect. Judgment normal.    ED Course  Procedures (including critical care time) Labs Review Labs Reviewed  POCT URINALYSIS DIP (DEVICE) - Abnormal; Notable for the following:    Leukocytes, UA SMALL (*)    All other components within normal limits  URINE CULTURE   Imaging Review No results found.   MDM   1. UTI (lower urinary tract infection)    Previous cultures reviewed, sensitive to bactrim, will treat with bactrim.  Culture sent.  F/u PRN    Meds ordered this encounter  Medications  . sulfamethoxazole-trimethoprim (BACTRIM DS) 800-160 MG per tablet    Sig: Take 1 tablet by mouth 2 (two) times daily.    Dispense:  10 tablet    Refill:  0    Order Specific Question:  Supervising Provider    Answer:  Lynne Leader, [place]  . phenazopyridine (PYRIDIUM) 200 MG tablet    Sig: Take 1 tablet (200 mg total) by mouth 3 (three) times daily.    Dispense:  6 tablet    Refill:  0    Order Specific Question:  Supervising Provider    Answer:  Lynne Leader, [place]       Liam Graham, PA-C 10/08/13 [phone]

## 2013-10-10 LAB — URINE CULTURE: Colony Count: >=100000

## 2013-10-10 NOTE — ED Notes (Addendum)
Urine culture:>100,000 colonies Lactobacillus.  Pt. adequately treated with Bactrim DS. Hanley Seamen [facility] 10/10/2013

## 2014-02-08 ENCOUNTER — Encounter (HOSPITAL_COMMUNITY): Payer: Self-pay | Admitting: Emergency Medicine

## 2014-02-08 ENCOUNTER — Emergency Department (HOSPITAL_COMMUNITY)
Admission: EM | Admit: 2014-02-08 | Discharge: 2014-02-08 | Disposition: A | Discharge status: Home / Self Care | Payer: BC Managed Care – PPO | Source: Home / Self Care | Attending: Family Medicine | Admitting: Family Medicine

## 2014-02-08 DIAGNOSIS — N3000 Acute cystitis without hematuria: Secondary | ICD-10-CM

## 2014-02-08 LAB — POCT URINALYSIS DIP (DEVICE)
Bilirubin Urine: NEGATIVE
Glucose, UA: NEGATIVE mg/dL
Hgb urine dipstick: NEGATIVE
Ketones, ur: NEGATIVE mg/dL
Nitrite: NEGATIVE
Protein, ur: NEGATIVE mg/dL
Specific Gravity, Urine: <=1.005 (ref 1.005–1.030)
Urobilinogen, UA: 0.2 mg/dL (ref 0.0–1.0)
pH: 6 (ref 5.0–8.0)

## 2014-02-08 LAB — POCT PREGNANCY, URINE: Preg Test, Ur: NEGATIVE

## 2014-02-08 MED ORDER — CEPHALEXIN 500 MG PO CAPS: 500.0000 mg | ORAL_CAPSULE | Freq: Four times a day (QID) | ORAL | Status: DC

## 2014-02-08 NOTE — ED Notes (Signed)
Pt c/o constant right flank pain onset 3-4 days Sx also include urinary freq Denies f/v/n/d, inj/trauma Alert w/no signs of acute distress.

## 2014-02-08 NOTE — ED Provider Notes (Signed)
CSN: [id number]     Arrival date & time 02/08/14  1725 History   First MD Initiated Contact with Patient 02/08/14 1757     Chief Complaint  Patient presents with  . Flank Pain   (Consider location/radiation/quality/duration/timing/severity/associated sxs/prior Treatment) Patient is a 48 y.o. female presenting with flank pain. The history is provided by the patient.  Flank Pain This is a new problem. The current episode started more than 2 days ago. The problem has not changed since onset.Pertinent negatives include no chest pain, no abdominal pain and no shortness of breath.    Past Medical History  Diagnosis Date  . Bladder mass    Past Surgical History  Procedure Laterality Date  . Cesarean section  [place]  . Tubal ligation  1995  . Hysteroscopy w/ endometrial ablation  07-17-2006  . Cystoscopy  07/11/2011    Procedure: CYSTOSCOPY;  Surgeon: Hanley Ben, MD;  Location: [facility];  Service: Urology;  Laterality: N/A;  TUR Bladder Mass  . Transurethral resection of bladder tumor  07/11/2011    Procedure: TRANSURETHRAL RESECTION OF BLADDER TUMOR (TURBT);  Surgeon: Hanley Ben, MD;  Location: [facility];  Service: Urology;  Laterality: N/A;  . Cystoscopy w/ retrogrades  07/11/2011    Procedure: CYSTOSCOPY WITH RETROGRADE PYELOGRAM;  Surgeon: Hanley Ben, MD;  Location: [place];  Service: Urology;  Laterality: Bilateral;  retrograde of bladder   No family history on file. History  Substance Use Topics  . Smoking status: Never Smoker   . Smokeless tobacco: Never Used  . Alcohol Use: Yes     Comment: OCCASIONAL   OB History   Grav Para Term Preterm Abortions TAB SAB Ect Mult Living                 Review of Systems  Constitutional: Negative.   Respiratory: Negative for shortness of breath.   Cardiovascular: Negative for chest pain.  Gastrointestinal: Negative for nausea, vomiting, abdominal pain, diarrhea,  constipation and blood in stool.  Genitourinary: Positive for frequency and flank pain. Negative for dysuria, hematuria, vaginal bleeding, vaginal discharge, menstrual problem and pelvic pain.    Allergies  Review of patient's allergies indicates no known allergies.  Home Medications   Prior to Admission medications   Medication Sig Start Date End Date Taking? Authorizing Provider  cephALEXin (KEFLEX) 250 MG capsule Take 1 capsule (250 mg total) by mouth 4 (four) times daily. 07/13/13   Janne Napoleon, NP  cephALEXin (KEFLEX) 500 MG capsule Take 1 capsule (500 mg total) by mouth 4 (four) times daily. Take all of medicine and drink lots of fluids 02/08/14   Billy Fischer, MD  cyclobenzaprine (FLEXERIL) 10 MG tablet Take 1 tablet (10 mg total) by mouth 3 (three) times daily as needed for muscle spasms. 10/25/12   Glorianne Manchester, MD  ibuprofen (ADVIL,MOTRIN) 600 MG tablet Take 1 tablet (600 mg total) by mouth every 6 (six) hours as needed for pain. 10/25/12   Glorianne Manchester, MD  phenazopyridine (PYRIDIUM) 200 MG tablet Take 1 tablet (200 mg total) by mouth 3 (three) times daily. 10/08/13   Liam Graham, PA-C  sulfamethoxazole-trimethoprim (BACTRIM DS) 800-160 MG per tablet Take 1 tablet by mouth 2 (two) times daily. 10/08/13   Liam Graham, PA-C  valACYclovir (VALTREX) 500 MG tablet Take 1 tablet bid x 3 days prn. 06/16/13   Shelly Bombard, MD   BP  119/76  Pulse 73  Temp(Src) 98.6 F (37 C) (Oral)  Resp 18  SpO2 100%  LMP 01/20/2014 Physical Exam  Nursing note and vitals reviewed. Constitutional: She is oriented to person, place, and time. She appears well-developed and well-nourished.  Abdominal: Soft. Bowel sounds are normal. She exhibits no distension and no mass. There is tenderness in the left upper quadrant. There is CVA tenderness. There is no rebound and no guarding.  Neurological: She is alert and oriented to person, place, and time.  Skin: Skin is warm and dry.     ED Course  Procedures (including critical care time) Labs Review Labs Reviewed  POCT URINALYSIS DIP (DEVICE) - Abnormal; Notable for the following:    Leukocytes, UA SMALL (*)    All other components within normal limits  URINE CULTURE  POCT PREGNANCY, URINE    Imaging Review No results found.   MDM   1. Acute cystitis without hematuria        Billy Fischer, MD 02/08/14 2037

## 2014-02-08 NOTE — Discharge Instructions (Signed)
Take all of medicine as directed, drink lots of fluids, see your doctor if further problems. °

## 2014-02-09 LAB — URINE CULTURE
Colony Count: 75000
Special Requests: NORMAL

## 2014-04-07 ENCOUNTER — Ambulatory Visit (INDEPENDENT_AMBULATORY_CARE_PROVIDER_SITE_OTHER): Payer: BC Managed Care – PPO | Admitting: Obstetrics & Gynecology

## 2014-04-07 ENCOUNTER — Encounter: Payer: Self-pay | Admitting: Obstetrics & Gynecology

## 2014-04-07 VITALS — BP 106/70 | HR 70 | Temp 97.8°F | Ht 68.0 in | Wt 153.0 lb

## 2014-04-07 DIAGNOSIS — B3731 Acute candidiasis of vulva and vagina: Secondary | ICD-10-CM

## 2014-04-07 DIAGNOSIS — B373 Candidiasis of vulva and vagina: Secondary | ICD-10-CM

## 2014-04-07 DIAGNOSIS — Z01419 Encounter for gynecological examination (general) (routine) without abnormal findings: Secondary | ICD-10-CM

## 2014-04-07 LAB — WET PREP BY MOLECULAR PROBE
Candida species: POSITIVE — AB
Gardnerella vaginalis: NEGATIVE
Trichomonas vaginosis: NEGATIVE

## 2014-04-07 LAB — CBC
HCT: 36.9 % (ref 36.0–46.0)
Hemoglobin: 12.8 g/dL (ref 12.0–15.0)
MCH: 29.2 pg (ref 26.0–34.0)
MCHC: 34.7 g/dL (ref 30.0–36.0)
MCV: 84.2 fL (ref 78.0–100.0)
Platelets: 387 10*3/uL (ref 150–400)
RBC: 4.38 MIL/uL (ref 3.87–5.11)
RDW: 13.7 % (ref 11.5–15.5)
WBC: 9.6 10*3/uL (ref 4.0–10.5)

## 2014-04-07 NOTE — Progress Notes (Signed)
Subjective:     Helen Walker is a 48 y.o. female here for a routine exam.      Personal health questionnaire:  Is patient Ashkenazi Jewish, have a family history of breast and/or ovarian cancer: yes Is there a family history of uterine cancer diagnosed at age < 19, gastrointestinal cancer, urinary tract cancer, family member who is a Field seismologist syndrome-associated carrier: no Is the patient overweight and hypertensive, family history of diabetes, personal history of gestational diabetes or PCOS: no Is patient over 30, have PCOS,  family history of premature CHD under age 3, diabetes, smoke, have hypertension or peripheral artery disease:  no At any time, has a partner hit, kicked or otherwise hurt or frightened you?: no Over the past 2 weeks, have you felt down, depressed or hopeless?: no Over the past 2 weeks, have you felt little interest or pleasure in doing things?:no   Gynecologic History Patient's last menstrual period was 03/20/2014. Contraception: tubal ligation Last Pap results were: normal Last mammogram: 2/15. Results were: normal  Obstetric History OB History  Gravida Para Term Preterm AB SAB TAB Ectopic Multiple Living  3 2 2  1     2     # Outcome Date GA Lbr Len/2nd Weight Sex Delivery Anes PTL Lv  3 TRM      LTCS   Y  2 TRM      LTCS   Y  1 ABT               Past Medical History  Diagnosis Date  . Bladder mass     Past Surgical History  Procedure Laterality Date  . Cesarean section  [place]  . Tubal ligation  1995  . Hysteroscopy w/ endometrial ablation  07-17-2006  . Cystoscopy  07/11/2011    Procedure: CYSTOSCOPY;  Surgeon: Hanley Ben, MD;  Location: [facility];  Service: Urology;  Laterality: N/A;  TUR Bladder Mass  . Transurethral resection of bladder tumor  07/11/2011    Procedure: TRANSURETHRAL RESECTION OF BLADDER TUMOR (TURBT);  Surgeon: Hanley Ben, MD;  Location: [facility];  Service: Urology;   Laterality: N/A;  . Cystoscopy w/ retrogrades  07/11/2011    Procedure: CYSTOSCOPY WITH RETROGRADE PYELOGRAM;  Surgeon: Hanley Ben, MD;  Location: [place];  Service: Urology;  Laterality: Bilateral;  retrograde of bladder    No current outpatient prescriptions on file. No Known Allergies  History  Substance Use Topics  . Smoking status: Never Smoker   . Smokeless tobacco: Never Used  . Alcohol Use: Yes     Comment: OCCASIONAL    Family History  Problem Relation Age of Onset  . Hypertension Mother   . Diabetes Mother       Review of Systems  Constitutional: negative for fatigue and weight loss Respiratory: negative for cough and wheezing Cardiovascular: negative for chest pain, fatigue and palpitations Gastrointestinal: negative for abdominal pain and change in bowel habits Musculoskeletal:negative for myalgias Neurological: negative for gait problems and tremors Behavioral/Psych: negative for abusive relationship, depression Endocrine: negative for temperature intolerance   Genitourinary:negative for abnormal menstrual periods, genital lesions, hot flashes; positive for sexual problems and vaginal discharge Integument/breast: negative for breast lump, breast tenderness, nipple discharge and skin lesion(s)    Objective:       BP 106/70  Pulse 70  Temp(Src) 97.8 F (36.6 C)  Ht 5\' 8"  (1.727 m)  Wt 69.4 kg (153 lb)  BMI 23.27  kg/m2  LMP 03/20/2014 General:   alert  Skin:   no rash or abnormalities  Lungs:   clear to auscultation bilaterally  Heart:   regular rate and rhythm, S1, S2 normal, no murmur, click, rub or gallop  Breasts:   normal without suspicious masses, skin or nipple changes or axillary nodes  Abdomen:  normal findings: no organomegaly, soft, non-tender and no hernia  Pelvis:  External genitalia: normal general appearance Urinary system: urethral meatus normal and bladder without fullness, nontender Vaginal: cheese-like vaginal  discharge Cervix: normal appearance Adnexa: normal bimanual exam Uterus: anteverted and non-tender, normal size   Lab Review  Labs reviewed no Radiologic studies reviewed yes Gail model: average risk    Assessment:    Healthy female exam.   Candida vulvovaginitis Plan:    Education reviewed: calcium supplements, low fat, low cholesterol diet and weight bearing exercise.  Diflucan Orders Placed This Encounter  Procedures  . WET PREP BY MOLECULAR PROBE  . TSH  . Comprehensive metabolic panel  . CBC  . Hemoglobin A1c  . NMR Lipoprofile with Lipids    Follow up as needed.

## 2014-04-08 LAB — COMPREHENSIVE METABOLIC PANEL
ALT: 11 U/L (ref 0–35)
AST: 15 U/L (ref 0–37)
Albumin: 4.3 g/dL (ref 3.5–5.2)
Alkaline Phosphatase: 53 U/L (ref 39–117)
BUN: 14 mg/dL (ref 6–23)
CO2: 26 mEq/L (ref 19–32)
Calcium: 9.4 mg/dL (ref 8.4–10.5)
Chloride: 99 mEq/L (ref 96–112)
Creat: 0.96 mg/dL (ref 0.50–1.10)
Glucose, Bld: 89 mg/dL (ref 70–99)
Potassium: 3.7 mEq/L (ref 3.5–5.3)
Sodium: 135 mEq/L (ref 135–145)
Total Bilirubin: 0.5 mg/dL (ref 0.2–1.2)
Total Protein: 8 g/dL (ref 6.0–8.3)

## 2014-04-08 LAB — HEMOGLOBIN A1C
Hgb A1c MFr Bld: 5.8 % — ABNORMAL HIGH (ref <5.7)
Mean Plasma Glucose: 120 mg/dL — ABNORMAL HIGH (ref <117)

## 2014-04-08 LAB — TSH: TSH: 0.994 u[IU]/mL (ref 0.350–4.500)

## 2014-04-09 DIAGNOSIS — B373 Candidiasis of vulva and vagina: Secondary | ICD-10-CM | POA: Insufficient documentation

## 2014-04-09 DIAGNOSIS — B3731 Acute candidiasis of vulva and vagina: Secondary | ICD-10-CM | POA: Insufficient documentation

## 2014-04-09 LAB — POCT WET PREP (WET MOUNT)
Clue Cells Wet Prep Whiff POC: NEGATIVE
KOH Wet Prep POC: NEGATIVE

## 2014-04-09 NOTE — Patient Instructions (Signed)

## 2014-04-10 LAB — NMR LIPOPROFILE WITH LIPIDS
Cholesterol, Total: 205 mg/dL — ABNORMAL HIGH (ref 100–199)
HDL Particle Number: 25.6 umol/L — ABNORMAL LOW (ref >=30.5)
HDL Size: 9.3 nm (ref >=9.2)
HDL-C: 51 mg/dL (ref >39)
LDL (calc): 128 mg/dL — ABNORMAL HIGH (ref 0–99)
LDL Particle Number: 1625 nmol/L — ABNORMAL HIGH (ref <1000)
LDL Size: 21.2 nm (ref >=20.8)
LP-IR Score: 29 (ref <=45)
Large HDL-P: 7.5 umol/L (ref >=4.8)
Large VLDL-P: 1.4 nmol/L (ref <=2.7)
Small LDL Particle Number: 482 nmol/L (ref <=527)
Triglycerides: 129 mg/dL (ref 0–149)
VLDL Size: 44.9 nm (ref <=46.6)

## 2014-04-10 LAB — PAP IG AND HPV HIGH-RISK: HPV DNA High Risk: NOT DETECTED

## 2014-04-11 ENCOUNTER — Other Ambulatory Visit: Payer: Self-pay | Admitting: *Deleted

## 2014-04-11 ENCOUNTER — Telehealth: Payer: Self-pay | Admitting: *Deleted

## 2014-04-11 DIAGNOSIS — B3731 Acute candidiasis of vulva and vagina: Secondary | ICD-10-CM

## 2014-04-11 DIAGNOSIS — B373 Candidiasis of vulva and vagina: Secondary | ICD-10-CM

## 2014-04-11 MED ORDER — FLUCONAZOLE 150 MG PO TABS: 150.0000 mg | ORAL_TABLET | ORAL | Status: DC

## 2014-04-11 NOTE — Telephone Encounter (Signed)
Patient called to reports her Rx was not at the pharmacy. Rx for yeast sent to the pharmacy.

## 2014-04-13 ENCOUNTER — Ambulatory Visit: Payer: BC Managed Care – PPO | Admitting: Obstetrics & Gynecology

## 2014-05-01 ENCOUNTER — Encounter: Payer: Self-pay | Admitting: Obstetrics & Gynecology

## 2014-06-26 ENCOUNTER — Encounter: Payer: Self-pay | Admitting: *Deleted

## 2014-06-27 ENCOUNTER — Encounter: Payer: Self-pay | Admitting: Obstetrics & Gynecology

## 2014-07-07 ENCOUNTER — Other Ambulatory Visit: Payer: Self-pay

## 2014-07-07 DIAGNOSIS — Z1231 Encounter for screening mammogram for malignant neoplasm of breast: Secondary | ICD-10-CM

## 2014-08-07 ENCOUNTER — Ambulatory Visit
Admission: RE | Admit: 2014-08-07 | Discharge: 2014-08-07 | Disposition: A | Discharge status: Home / Self Care | Payer: BLUE CROSS/BLUE SHIELD | Source: Ambulatory Visit

## 2014-08-07 DIAGNOSIS — Z1231 Encounter for screening mammogram for malignant neoplasm of breast: Secondary | ICD-10-CM

## 2014-12-07 ENCOUNTER — Encounter: Payer: Self-pay | Admitting: Gynecology

## 2014-12-07 ENCOUNTER — Ambulatory Visit (INDEPENDENT_AMBULATORY_CARE_PROVIDER_SITE_OTHER): Payer: BLUE CROSS/BLUE SHIELD | Admitting: Gynecology

## 2014-12-07 VITALS — BP 112/74 | Ht 66.75 in | Wt 148.0 lb

## 2014-12-07 DIAGNOSIS — Z23 Encounter for immunization: Secondary | ICD-10-CM

## 2014-12-07 DIAGNOSIS — N938 Other specified abnormal uterine and vaginal bleeding: Secondary | ICD-10-CM

## 2014-12-07 DIAGNOSIS — N951 Menopausal and female climacteric states: Secondary | ICD-10-CM

## 2014-12-07 DIAGNOSIS — R8290 Unspecified abnormal findings in urine: Secondary | ICD-10-CM

## 2014-12-07 HISTORY — DX: Menopausal and female climacteric states: N95.1

## 2014-12-07 LAB — URINALYSIS W MICROSCOPIC + REFLEX CULTURE
Bilirubin Urine: NEGATIVE
Casts: NONE SEEN
Crystals: NONE SEEN
Glucose, UA: NEGATIVE mg/dL
Ketones, ur: NEGATIVE mg/dL
Leukocytes, UA: NEGATIVE
Nitrite: NEGATIVE
Protein, ur: NEGATIVE mg/dL
RBC / HPF: NONE SEEN RBC/hpf (ref <3)
Specific Gravity, Urine: 1.025 (ref 1.005–1.030)
Urobilinogen, UA: 0.2 mg/dL (ref 0.0–1.0)
WBC, UA: NONE SEEN WBC/hpf (ref <3)
pH: 5.5 (ref 5.0–8.0)

## 2014-12-07 NOTE — Patient Instructions (Signed)

## 2014-12-07 NOTE — Progress Notes (Signed)
   Patient is a 49 year old gravida 3 para 2 Ab1 (1 ectopic, 2 cesarean sections, bilateral tubal ligation) who is new to the practice. She was previously being followed by Dr. Glennon Mac more on October 2015 that her annual gynecological examination. Patient reports normal Pap smears in the past. She is here because for the past 6 months she has been spotting in between cycles. She also has complained at times of vaginal dryness. Some vasomotor symptoms reported. She also states in 2007 she had an endometrial ablation/NovaSure technique. Also review of her record indicated in 2014 she had a transurethral resection of a bladder mass which was reported as benign smooth muscle. Her mammogram was this year which was reported to be normal.  Exam: Blood pressure 112/74  148 pounds  BMI 23.35 kg/m Gen. appearance well-developed 1 a female with above complaint Abdomen: Slightly tender suprapubically been no rebound or guarding Pelvic: Bartholin urethra Skene was within normal limits Vagina or blood noted in the vaginal vault Cervix: No active bleeding Uterus slightly tender on bimanual examination appears to have upper limits of normal uterus Adnexa no discernible mass on exam Rectal exam not done  Patient got that her urine had an odor to it so urinalysis was obtained which demonstrated rare bacteria and otherwise normal.  She was counseled for an endometrial biopsy. The cervix was cleansed with Betadine solution and a Pipelle was introduced into the uterine cavity whereby only approximate 4 cm before I could not advance the catheter for that. Some tissue was obtained and was submitted for histological evaluation.  Assessment/plan: Perimenopausal patient with dysfunctional uterine bleeding. 11 to check an [place], TSH, and prolactin. Attempted endometrial biopsy with only 4 cm of the catheter being able to be passed into the uterine cavity probably as a result of previous ablation and agglutination of uterine  wall. She will return back to the office next week for sonohysterogram. We discussed that her cramping and bleeding may be attributed to post ablation syndrome and that she could potentially end up with a laparoscopic-assisted vaginal hysterectomy in the near future to help with her symptoms. We'll complete evaluation first and manage accordingly.

## 2014-12-08 ENCOUNTER — Telehealth: Payer: Self-pay

## 2014-12-08 ENCOUNTER — Other Ambulatory Visit: Payer: Self-pay | Admitting: Gynecology

## 2014-12-08 DIAGNOSIS — N939 Abnormal uterine and vaginal bleeding, unspecified: Secondary | ICD-10-CM

## 2014-12-08 DIAGNOSIS — Z9889 Other specified postprocedural states: Secondary | ICD-10-CM

## 2014-12-08 LAB — PROLACTIN: Prolactin: 14.3 ng/mL

## 2014-12-08 LAB — TSH: TSH: 0.782 u[IU]/mL (ref 0.350–4.500)

## 2014-12-08 LAB — FOLLICLE STIMULATING HORMONE: FSH: 3.8 m[IU]/mL

## 2014-12-08 MED ORDER — TRAMADOL HCL 50 MG PO TABS: 50.0000 mg | ORAL_TABLET | Freq: Four times a day (QID) | ORAL | Status: DC | PRN

## 2014-12-08 NOTE — Telephone Encounter (Signed)
Patient said when she was here yesterday she asked you about getting pain medication.  She said you two got busy with other things and she left without getting Rx.

## 2014-12-08 NOTE — Telephone Encounter (Signed)
Ultram 50 mg one PO q 6 hours PRN # 30

## 2014-12-08 NOTE — Telephone Encounter (Signed)
Rx phoned in to pharmacy. Patient informed.

## 2014-12-11 ENCOUNTER — Telehealth: Payer: Self-pay | Admitting: Gynecology

## 2014-12-11 ENCOUNTER — Telehealth: Payer: Self-pay | Admitting: *Deleted

## 2014-12-11 MED ORDER — MEGESTROL ACETATE 40 MG PO TABS: ORAL_TABLET | ORAL | Status: DC

## 2014-12-11 NOTE — Telephone Encounter (Signed)
Call in prescription for Megace 40 mg one PO BID for 10-14 days. # 30

## 2014-12-11 NOTE — Telephone Encounter (Signed)
12/11/14-I spoke w/pt today to let her know that she has only met $100.74 of her $5450.00 deductible and that she would be responsible for the $851.58 for the test and possibly $244.51 if biopsy is needed. She has an HSA plan with enough money to cover it and will use that to pay the bill/wl

## 2014-12-11 NOTE — Telephone Encounter (Signed)
Pt called to follow up from [place] on 12/07/14 states she has been bleeding over the weekend changing pads every 3 hours, has pain 5 out of 10 takes ultram for this which helps. Pt said she feels weak and faint, leaving work due to this. Has [facility] schedule on 12/13/14. Asked for recommendations? Please advise

## 2014-12-11 NOTE — Telephone Encounter (Signed)
Pt aware Rx sent.  

## 2014-12-13 ENCOUNTER — Ambulatory Visit (INDEPENDENT_AMBULATORY_CARE_PROVIDER_SITE_OTHER): Payer: BLUE CROSS/BLUE SHIELD

## 2014-12-13 ENCOUNTER — Other Ambulatory Visit: Payer: Self-pay | Admitting: Gynecology

## 2014-12-13 ENCOUNTER — Ambulatory Visit (INDEPENDENT_AMBULATORY_CARE_PROVIDER_SITE_OTHER): Payer: BLUE CROSS/BLUE SHIELD | Admitting: Gynecology

## 2014-12-13 VITALS — Ht 66.0 in | Wt 148.0 lb

## 2014-12-13 DIAGNOSIS — N840 Polyp of corpus uteri: Secondary | ICD-10-CM | POA: Diagnosis not present

## 2014-12-13 DIAGNOSIS — N938 Other specified abnormal uterine and vaginal bleeding: Secondary | ICD-10-CM

## 2014-12-13 DIAGNOSIS — N939 Abnormal uterine and vaginal bleeding, unspecified: Secondary | ICD-10-CM | POA: Diagnosis not present

## 2014-12-13 DIAGNOSIS — R102 Pelvic and perineal pain: Secondary | ICD-10-CM

## 2014-12-13 DIAGNOSIS — D251 Intramural leiomyoma of uterus: Secondary | ICD-10-CM

## 2014-12-13 DIAGNOSIS — Z9889 Other specified postprocedural states: Secondary | ICD-10-CM

## 2014-12-13 LAB — CBC WITH DIFFERENTIAL/PLATELET
Basophils Absolute: 0.1 10*3/uL (ref 0.0–0.1)
Basophils Relative: 1 % (ref 0–1)
Eosinophils Absolute: 0.1 10*3/uL (ref 0.0–0.7)
Eosinophils Relative: 1 % (ref 0–5)
HCT: 38.6 % (ref 36.0–46.0)
Hemoglobin: 13.1 g/dL (ref 12.0–15.0)
Lymphocytes Relative: 54 % — ABNORMAL HIGH (ref 12–46)
Lymphs Abs: 3.9 10*3/uL (ref 0.7–4.0)
MCH: 29.9 pg (ref 26.0–34.0)
MCHC: 33.9 g/dL (ref 30.0–36.0)
MCV: 88.1 fL (ref 78.0–100.0)
MPV: 8.4 fL — ABNORMAL LOW (ref 8.6–12.4)
Monocytes Absolute: 0.4 10*3/uL (ref 0.1–1.0)
Monocytes Relative: 6 % (ref 3–12)
Neutro Abs: 2.8 10*3/uL (ref 1.7–7.7)
Neutrophils Relative %: 38 % — ABNORMAL LOW (ref 43–77)
Platelets: 381 10*3/uL (ref 150–400)
RBC: 4.38 MIL/uL (ref 3.87–5.11)
RDW: 13.4 % (ref 11.5–15.5)
WBC: 7.3 10*3/uL (ref 4.0–10.5)

## 2014-12-13 NOTE — Patient Instructions (Signed)
Fibroids Fibroids are lumps (tumors) that can occur any place in a woman's body. These lumps are not cancerous. Fibroids vary in size, weight, and where they grow. HOME CARE  Do not take aspirin.  Write down the number of pads or tampons you use during your period. Tell your doctor. This can help determine the best treatment for you. GET HELP RIGHT AWAY IF:  You have pain in your lower belly (abdomen) that is not helped with medicine.  You have cramps that are not helped with medicine.  You have more bleeding between or during your period.  You feel lightheaded or pass out (faint).  Your lower belly pain gets worse. MAKE SURE YOU:  Understand these instructions.  Will watch your condition.  Will get help right away if you are not doing well or get worse. Document Released: 07/19/2010 Document Revised: 09/08/2011 Document Reviewed: 07/19/2010 [facility] Patient Information 2015 [place], [place]. This information is not intended to replace advice given to you by your health care provider. Make sure you discuss any questions you have with your health care provider. Laparoscopically Assisted Vaginal Hysterectomy  A laparoscopically assisted vaginal hysterectomy (LAVH) is a surgical procedure to remove the uterus and cervix, and sometimes the ovaries and fallopian tubes. During an LAVH, some of the surgical removal is done through the vagina, and the rest is done through a few small surgical cuts (incisions) in the abdomen.  This procedure is usually considered in women when a vaginal hysterectomy is not an option. Your health care provider will discuss the risks and benefits of the different surgical techniques at your appointment. Generally, recovery time is faster and there are fewer complications after laparoscopic procedures than after open incisional procedures. LET [facility] CARE PROVIDER KNOW ABOUT:   Any allergies you have.  All medicines you are taking, including vitamins,  herbs, eye drops, creams, and over-the-counter medicines.  Previous problems you or members of your family have had with the use of anesthetics.  Any blood disorders you have.  Previous surgeries you have had.  Medical conditions you have. RISKS AND COMPLICATIONS Generally, this is a safe procedure. However, as with any procedure, complications can occur. Possible complications include:  Allergies to medicines.  Difficulty breathing.  Bleeding.  Infection.  Damage to other structures near your uterus and cervix. BEFORE THE PROCEDURE  Ask your health care provider about changing or stopping your regular medicines.  Take certain medicines, such as a colon-emptying preparation, as directed.  Do not eat or drink anything for at least 8 hours before your surgery.  Stop smoking if you smoke. Stopping will improve your health after surgery.  Arrange for a ride home after surgery and for help at home during recovery. PROCEDURE   An IV tube will be put into one of your veins in order to give you fluids and medicines.  You will receive medicines to relax you and medicines that make you sleep (general anesthetic).  You may have a flexible tube (catheter) put into your bladder to drain urine.  You may have a tube put through your nose or mouth that goes into your stomach (nasogastric tube). The nasogastric tube removes digestive fluids and prevents you from feeling nauseated and from vomiting.  Tight-fitting (compression) stockings will be placed on your legs to promote circulation.  Three to four small incisions will be made in your abdomen. An incision also will be made in your vagina. Probes and tools will be inserted into the small incisions. The  uterus and cervix are removed (and possibly your ovaries and fallopian tubes) through your vagina as well as through the small incisions that were made in the abdomen.  Your vagina is then sewn back to normal. AFTER THE  PROCEDURE  You may have a liquid diet temporarily. You will most likely return to, and tolerate, your usual diet the day after surgery.  You will be passing urine through a catheter. It will be removed the day after surgery.  Your temperature, breathing rate, heart rate, blood pressure, and oxygen level will be monitored regularly.  You will still wear compression stockings on your legs until you are able to move around.  You will use a special device or do breathing exercises to keep your lungs clear.  You will be encouraged to walk as soon as possible. Document Released: 06/05/2011 Document Revised: 02/16/2013 Document Reviewed: 12/30/2012 [facility] Patient Information 2015 [place], [place]. This information is not intended to replace advice given to you by your health care provider. Make sure you discuss any questions you have with your health care provider.

## 2014-12-13 NOTE — Progress Notes (Signed)
   Patient is a 49 year old gravida 3 para 2 Ab1 (1 ectopic, 2 cesarean sections, bilateral tubal ligation) who was seen as a new patient to the practice on 12/07/2014.She was previously being followed by Dr. Glennon Mac more on October 2015 that her annual gynecological examination. Patient reports normal Pap smears in the past. She is here because for the past 6 months she has been spotting in between cycles. She also has complained at times of vaginal dryness. Some vasomotor symptoms reported. She also states in 2007 she had an endometrial ablation/NovaSure technique. Also review of her record indicated in 2014 she had a transurethral resection of a bladder mass which was reported as benign smooth muscle. Her mammogram was this year which was reported to be normal. An attempted endometrial biopsy was done at that office visit and we were not able to enter the cavity completely. The little sample that was obtained was needed for histological evaluation and pathology report demonstrated with benign endocervical cells. No endometrial tissue. She is here today for an attempt at doing an ultrasound/sono hysterogram.  Ultrasound today: Uterus measured 9.9 x 5.5 x 4.9 cm with endometrial stripe at 2.4 mm. Patient had 2 intramural fibroids the largest one measuring 37 x 27 mm. Fluid pocket measuring 10 x 3 mm was noted in the myometrium. Right left ovary were normal. After was inserted cleansed with Betadine solution a catheter was able to be filled and at least half of the cavity and normal saline was instilled and a 10 x 4 intrauterine defect was noted lower endometrial segment. However part of the uterus difficult to evaluate.  Assessment/plan: Patient with apparent post-ablation syndrome contributing to intermenstrual spotting and pelvic pain. Patient will stop by the lab to check her CBC. She is currently taking Megace 40 mg twice a day which is help stop her bleeding. We are going to plan on scheduling a  laparoscopic-assisted vaginal hysterectomy with bilateral salpingectomy. Patient will be seen for preoperative examination the week prior to her surgery.

## 2014-12-14 ENCOUNTER — Telehealth: Payer: Self-pay

## 2014-12-14 NOTE — Telephone Encounter (Signed)
No restriction

## 2014-12-14 NOTE — Telephone Encounter (Signed)
I called patient and discussed her ins benefits and estimated financial responsibility to [facility] for her surgery. She is ready to schedule. Scheduled for 02/06/15 and will see Dr. Moshe Salisbury for pre op on 01/24/15 at 4:00pm.  Patient asked me was there any reason she should not work/exercise now?

## 2014-12-15 NOTE — Telephone Encounter (Signed)
Patient informed. 

## 2014-12-18 ENCOUNTER — Encounter: Payer: Self-pay | Admitting: Gynecology

## 2014-12-19 ENCOUNTER — Other Ambulatory Visit: Payer: Self-pay

## 2014-12-19 ENCOUNTER — Other Ambulatory Visit: Payer: Self-pay | Admitting: Gynecology

## 2014-12-19 ENCOUNTER — Telehealth: Payer: Self-pay

## 2014-12-19 NOTE — Telephone Encounter (Signed)
Patient called asking if there might be any cancellations for her LAVH to be sooner than August 9th. I called her back to let her know I could schedule her for July 26 at 9:30am.  Patient would like July 26 so I rescheduled her. I assured her if anyone cancels even sooner I will call her.

## 2014-12-20 NOTE — Telephone Encounter (Signed)
Called into pharmacy

## 2014-12-29 DIAGNOSIS — Z0289 Encounter for other administrative examinations: Secondary | ICD-10-CM

## 2015-01-12 ENCOUNTER — Other Ambulatory Visit: Payer: Self-pay | Admitting: Gynecology

## 2015-01-12 NOTE — Telephone Encounter (Signed)
LAVH is scheduled 01/23/15.

## 2015-01-16 ENCOUNTER — Encounter: Payer: Self-pay | Admitting: Gynecology

## 2015-01-16 ENCOUNTER — Encounter (HOSPITAL_COMMUNITY): Payer: Self-pay

## 2015-01-16 ENCOUNTER — Ambulatory Visit (INDEPENDENT_AMBULATORY_CARE_PROVIDER_SITE_OTHER): Payer: BLUE CROSS/BLUE SHIELD | Admitting: Gynecology

## 2015-01-16 ENCOUNTER — Encounter (HOSPITAL_COMMUNITY)
Admission: RE | Admit: 2015-01-16 | Discharge: 2015-01-16 | Disposition: A | Discharge status: Home / Self Care | Payer: BLUE CROSS/BLUE SHIELD | Source: Ambulatory Visit | Attending: Gynecology | Admitting: Gynecology

## 2015-01-16 ENCOUNTER — Encounter (INDEPENDENT_AMBULATORY_CARE_PROVIDER_SITE_OTHER): Payer: Self-pay

## 2015-01-16 VITALS — BP 136/78 | Ht 66.0 in | Wt 152.0 lb

## 2015-01-16 DIAGNOSIS — Z01818 Encounter for other preprocedural examination: Secondary | ICD-10-CM

## 2015-01-16 DIAGNOSIS — R102 Pelvic and perineal pain: Secondary | ICD-10-CM | POA: Diagnosis not present

## 2015-01-16 DIAGNOSIS — D259 Leiomyoma of uterus, unspecified: Secondary | ICD-10-CM | POA: Insufficient documentation

## 2015-01-16 DIAGNOSIS — N938 Other specified abnormal uterine and vaginal bleeding: Secondary | ICD-10-CM

## 2015-01-16 DIAGNOSIS — N946 Dysmenorrhea, unspecified: Secondary | ICD-10-CM

## 2015-01-16 DIAGNOSIS — N840 Polyp of corpus uteri: Secondary | ICD-10-CM | POA: Diagnosis not present

## 2015-01-16 HISTORY — DX: Other specified health status: Z78.9

## 2015-01-16 LAB — CBC
HCT: 36.5 % (ref 36.0–46.0)
Hemoglobin: 12.3 g/dL (ref 12.0–15.0)
MCH: 29.7 pg (ref 26.0–34.0)
MCHC: 33.7 g/dL (ref 30.0–36.0)
MCV: 88.2 fL (ref 78.0–100.0)
Platelets: 325 10*3/uL (ref 150–400)
RBC: 4.14 MIL/uL (ref 3.87–5.11)
RDW: 13.1 % (ref 11.5–15.5)
WBC: 8.9 10*3/uL (ref 4.0–10.5)

## 2015-01-16 LAB — TYPE AND SCREEN
ABO/RH(D): O POS
Antibody Screen: NEGATIVE

## 2015-01-16 MED ORDER — METOCLOPRAMIDE HCL 10 MG PO TABS: 10.0000 mg | ORAL_TABLET | Freq: Three times a day (TID) | ORAL | Status: DC

## 2015-01-16 MED ORDER — OXYCODONE-ACETAMINOPHEN 5-325 MG PO TABS: 1.0000 | ORAL_TABLET | ORAL | Status: DC | PRN

## 2015-01-16 MED ORDER — PHENAZOPYRIDINE HCL 200 MG PO TABS: ORAL_TABLET | ORAL | Status: DC

## 2015-01-16 NOTE — Patient Instructions (Addendum)
Your procedure is scheduled on:01/23/15  Enter through the Main Entrance at :8am Pick up desk phone and dial [phone] and inform us of your arrival.  Please call [phone] if you have any problems the morning of surgery.  Remember: Do not eat food after midnight:Monday Clear liquids are ok until:5am on Tuesday  You may brush your teeth the morning of surgery.   DO NOT wear jewelry, eye make-up, lipstick,body lotion, or dark fingernail polish.  (Polished toes are ok) You may wear deodorant.  If you are to be admitted after surgery, leave suitcase in car until your room has been assigned. Patients discharged on the day of surgery will not be allowed to drive home. Wear loose fitting, comfortable clothes for your ride home.

## 2015-01-16 NOTE — Progress Notes (Signed)
Helen Walker is an 49 y.o. female who presented to the office today for preoperative exam. Patient scheduled for laparoscopic-assisted vaginal hysterectomy with bilateral salpingectomy and cystoscopy next week as a result of her progressive dysmenorrhea menorrhagia and intermenstrual bleeding. Patient is a gravida 3 para 2 Ab1 (1 ectopic, 2 cesarean sections, bilateral tubal ligation) who was seen as a new patient to the practice on 12/07/2014.She was previously being followed by Dr. Glennon Mac more on October 2015 that her annual gynecological examination. Patient reports normal Pap smears in the past. She is here because for the past 6 months she has been spotting in between cycles. She also has complained at times of vaginal dryness. Some vasomotor symptoms reported. She also states in 2007 she had an endometrial ablation/NovaSure technique. Also review of her record indicated in 2014 she had a transurethral resection of a bladder mass which was reported as benign smooth muscle. Her mammogram was this year which was reported to be normal. An attempted endometrial biopsy was done at that office visit and we were not able to enter the cavity completely. The little sample that was obtained was needed for histological evaluation and pathology report demonstrated with benign endocervical cells. No endometrial tissue.  An ultrasound and attempted sonohysterogram on June 15 of this year demonstrated the following: Uterus measured 9.9 x 5.5 x 4.9 cm with endometrial stripe at 2.4 mm. Patient had 2 intramural fibroids the largest one measuring 37 x 27 mm. Fluid pocket measuring 10 x 3 mm was noted in the myometrium. Right left ovary were normal. After was inserted cleansed with Betadine solution a catheter was able to be filled and at least half of the cavity and normal saline was instilled and a 10 x 4 intrauterine defect was noted lower endometrial segment. However part of the uterus difficult to  evaluate.  She was prescribed Megace 40 mg one by mouth twice a day to help stop her bleeding had demonstrated hemoglobin 13.1 and hematocrit 30.6 and a platelet count 391,000.  Pertinent Gynecological History: Menses: Intermenstrual bleeding with extensive cramping Bleeding: intermenstrual bleeding Contraception: tubal ligation DES exposure: unknown Blood transfusions: none Sexually transmitted diseases: HSV  Previous GYN Procedures: 2 C-sections, tubal ligation, endometrial ablation, ectopic Last mammogram: normal Date: 2016st pap: normal Date: 2015  History: G3 P2A1   Menstrual History: Menarche age:  68 Patient's last menstrual period was 11/16/2014 (approximate).    Past Medical History  Diagnosis Date  . Bladder mass 2013  . Medical history non-contributory     Past Surgical History  Procedure Laterality Date  . Cesarean section  [place]  . Tubal ligation  1995  . Hysteroscopy w/ endometrial ablation  07-17-2006  . Cystoscopy  07/11/2011    Procedure: CYSTOSCOPY;  Surgeon: Hanley Ben, MD;  Location: [facility];  Service: Urology;  Laterality: N/A;  TUR Bladder Mass  . Transurethral resection of bladder tumor  07/11/2011    Procedure: TRANSURETHRAL RESECTION OF BLADDER TUMOR (TURBT);  Surgeon: Hanley Ben, MD;  Location: [facility];  Service: Urology;  Laterality: N/A;  . Cystoscopy w/ retrogrades  07/11/2011    Procedure: CYSTOSCOPY WITH RETROGRADE PYELOGRAM;  Surgeon: Hanley Ben, MD;  Location: [place];  Service: Urology;  Laterality: Bilateral;  retrograde of bladder    Family History  Problem Relation Age of Onset  . Hypertension Mother   . Diabetes Mother     Social History:  reports that she has  never smoked. She has never used smokeless tobacco. She reports that she drinks alcohol. She reports that she does not use illicit drugs.  Allergies: No Known Allergies   (Not in a hospital  admission)  REVIEW OF SYSTEMS: A ROS was performed and pertinent positives and negatives are included in the history.  GENERAL: No fevers or chills. HEENT: No change in vision, no earache, sore throat or sinus congestion. NECK: No pain or stiffness. CARDIOVASCULAR: No chest pain or pressure. No palpitations. PULMONARY: No shortness of breath, cough or wheeze. GASTROINTESTINAL: No abdominal pain, nausea, vomiting or diarrhea, melena or bright red blood per rectum. GENITOURINARY: No urinary frequency, urgency, hesitancy or dysuria. MUSCULOSKELETAL: No joint or muscle pain, no back pain, no recent trauma. DERMATOLOGIC: No rash, no itching, no lesions. ENDOCRINE: No polyuria, polydipsia, no heat or cold intolerance. No recent change in weight. HEMATOLOGICAL: No anemia or easy bruising or bleeding. NEUROLOGIC: No headache, seizures, numbness, tingling or weakness. PSYCHIATRIC: No depression, no loss of interest in normal activity or change in sleep pattern.     Blood pressure 136/78, height 5\' 6"  (1.676 m), weight 152 lb (68.947 kg), last menstrual period 11/16/2014.  Physical Exam:  HEENT:unremarkable Neck:Supple, midline, no thyroid megaly, no carotid bruits Lungs:  Clear to auscultation no rhonchi's or wheezes Heart:Regular rate and rhythm, no murmurs or gallops Breast ExSymmetrical in appearance no palpable masses or tenderness no supraclavicular axillary lymphadenopathy Abdomen:Soft nontender no rebound or guarding Pfannenstiel scar present subumbilical scar present BUS within normal limits  Vagina:no lesions or discharge, brown stained dark blood Cervix no lesions or discharge  Uterus:Anteverted 8-10 weeks size Adnexa: No palpable masses or tenderness  Extremities: No cords, no edema Rectal : Not examined   Results for orders placed or performed during the hospital encounter of 01/16/15 (from the past 24 hour(s))  CBC     Status: None   Collection Time: 01/16/15  2:25 PM  Result Value  Ref Range   WBC 8.9 4.0 - 10.5 K/uL   RBC 4.14 3.87 - 5.11 MIL/uL   Hemoglobin 12.3 12.0 - 15.0 g/dL   HCT 36.5 36.0 - 46.0 %   MCV 88.2 78.0 - 100.0 fL   MCH 29.7 26.0 - 34.0 pg   MCHC 33.7 30.0 - 36.0 g/dL   RDW 13.1 11.5 - 15.5 %   Platelets 325 150 - 400 K/uL  Type and screen     Status: None   Collection Time: 01/16/15  2:25 PM  Result Value Ref Range   ABO/RH(D) O POS    Antibody Screen NEG    Sample Expiration 01/30/2015     No results found.  [age]essmen58 year old patient with apparent post-ablation syndrome contributing to intermenstrual spotting and pelvic pain is scheduled to undergo laparoscopic-assisted vaginal hysterectomy with bilateral salpingectomy next week. The following risk were discussed:                         Patient was counseled as to the risk of surgery to include the following:  1. Infection (prohylactic antibiotics will be administered)  2. DVT/Pulmonary Embolism (prophylactic pneumo compression stockings will be used)  3.Trauma to internal organs requiring additional surgical procedure to repair any injury to     Internal organs requiring perhaps additional hospitalization days.  4.Hemmorhage requiring transfusion and blood products which carry risks such as             anaphylactic reaction, hepatitis and AIDS  Patient had received  literature information on the procedure scheduled and all her questions were answered and fully accepts all risk.  We discussed that we will do an examination under anesthesia of it feels that the uterus does not come down sufficiently or during laparoscopic evaluation that if we cannot do the surgery laparoscopically we'll have to do a laparotomy which will require her to be in the hospital for 2 days. She was provided with Pyridium 200 mg to take 1 by mouth 3 times a day the day before the procedure in the morning of the procedure since we are going to do a cystoscopy after the hysterectomy to confirm bilateral ureteral  patency. Also she was prescribed Percocet and Reglan for postop analgesia and and timed A.   Karlon Schlafer H 01/16/2015, 4:00 PM

## 2015-01-16 NOTE — Patient Instructions (Signed)

## 2015-01-17 LAB — ABO/RH: ABO/RH(D): O POS

## 2015-01-22 MED ORDER — DEXTROSE 5 % IV SOLN
2.0000 g | INTRAVENOUS | Status: AC
  Administered 2015-01-23: 2 g via INTRAVENOUS
  Filled 2015-01-22: qty 2

## 2015-01-22 NOTE — Anesthesia Preprocedure Evaluation (Addendum)
Anesthesia Evaluation  Patient identified by MRN, date of birth, ID band Patient awake    Reviewed: Allergy & Precautions, H&P , Patient's Chart, lab work & pertinent test results, reviewed documented beta blocker date and time   Airway Mallampati: II  TM Distance: >3 FB Neck ROM: full    Dental no notable dental hx. (+)    Pulmonary  breath sounds clear to auscultation  Pulmonary exam normal       Cardiovascular Rhythm:regular Rate:Normal     Neuro/Psych    GI/Hepatic   Endo/Other    Renal/GU      Musculoskeletal   Abdominal   Peds  Hematology   Anesthesia Other Findings Medical history non-contributory       Reproductive/Obstetrics                            Anesthesia Physical Anesthesia Plan  ASA: II  Anesthesia Plan: General   Post-op Pain Management:    Induction: Intravenous  Airway Management Planned: Oral ETT  Additional Equipment:   Intra-op Plan:   Post-operative Plan: Extubation in OR  Informed Consent: I have reviewed the patients History and Physical, chart, labs and discussed the procedure including the risks, benefits and alternatives for the proposed anesthesia with the patient or authorized representative who has indicated his/her understanding and acceptance.   Dental Advisory Given and Dental advisory given  Plan Discussed with: CRNA and Surgeon  Anesthesia Plan Comments: (  Discussed general anesthesia, including possible nausea, instrumentation of airway, sore throat,pulmonary aspiration, etc. I asked if the were any outstanding questions, or  concerns before we proceeded. )        Anesthesia Quick Evaluation

## 2015-01-23 ENCOUNTER — Ambulatory Visit (HOSPITAL_COMMUNITY): Payer: BLUE CROSS/BLUE SHIELD | Admitting: Anesthesiology

## 2015-01-23 ENCOUNTER — Inpatient Hospital Stay (HOSPITAL_COMMUNITY)
Admission: RE | Admit: 2015-01-23 | Discharge: 2015-01-24 | DRG: 743 | Disposition: A | Discharge status: Home / Self Care | Payer: BLUE CROSS/BLUE SHIELD | Source: Ambulatory Visit | Attending: Gynecology | Admitting: Gynecology

## 2015-01-23 ENCOUNTER — Encounter (HOSPITAL_COMMUNITY)
Admission: RE | Disposition: A | Discharge status: Home / Self Care | Payer: Self-pay | Source: Ambulatory Visit | Attending: Gynecology

## 2015-01-23 ENCOUNTER — Encounter (HOSPITAL_COMMUNITY): Payer: Self-pay | Admitting: Anesthesiology

## 2015-01-23 DIAGNOSIS — Z833 Family history of diabetes mellitus: Secondary | ICD-10-CM

## 2015-01-23 DIAGNOSIS — N938 Other specified abnormal uterine and vaginal bleeding: Secondary | ICD-10-CM | POA: Diagnosis present

## 2015-01-23 DIAGNOSIS — D259 Leiomyoma of uterus, unspecified: Principal | ICD-10-CM | POA: Diagnosis present

## 2015-01-23 DIAGNOSIS — Z4889 Encounter for other specified surgical aftercare: Secondary | ICD-10-CM

## 2015-01-23 DIAGNOSIS — N920 Excessive and frequent menstruation with regular cycle: Secondary | ICD-10-CM | POA: Diagnosis present

## 2015-01-23 DIAGNOSIS — N946 Dysmenorrhea, unspecified: Secondary | ICD-10-CM | POA: Diagnosis present

## 2015-01-23 DIAGNOSIS — D251 Intramural leiomyoma of uterus: Secondary | ICD-10-CM

## 2015-01-23 DIAGNOSIS — K3589 Other acute appendicitis: Secondary | ICD-10-CM

## 2015-01-23 DIAGNOSIS — N800 Endometriosis of uterus: Secondary | ICD-10-CM | POA: Diagnosis present

## 2015-01-23 DIAGNOSIS — K388 Other specified diseases of appendix: Secondary | ICD-10-CM | POA: Diagnosis present

## 2015-01-23 DIAGNOSIS — N736 Female pelvic peritoneal adhesions (postinfective): Secondary | ICD-10-CM

## 2015-01-23 DIAGNOSIS — N805 Endometriosis of intestine: Secondary | ICD-10-CM

## 2015-01-23 DIAGNOSIS — Z8249 Family history of ischemic heart disease and other diseases of the circulatory system: Secondary | ICD-10-CM | POA: Diagnosis not present

## 2015-01-23 HISTORY — PX: APPENDECTOMY: SHX54

## 2015-01-23 HISTORY — PX: LAPAROSCOPIC LYSIS OF ADHESIONS: SHX5905

## 2015-01-23 HISTORY — DX: Encounter for other specified surgical aftercare: Z48.89

## 2015-01-23 HISTORY — PX: LAPAROSCOPIC ASSISTED VAGINAL HYSTERECTOMY: SHX5398

## 2015-01-23 LAB — PREGNANCY, URINE: Preg Test, Ur: NEGATIVE

## 2015-01-23 SURGERY — HYSTERECTOMY, VAGINAL, LAPAROSCOPY-ASSISTED
Anesthesia: General | Site: Abdomen

## 2015-01-23 MED ORDER — SODIUM CHLORIDE 0.9 % IJ SOLN: 9.0000 mL | INTRAMUSCULAR | Status: DC | PRN

## 2015-01-23 MED ORDER — HYDROMORPHONE HCL 1 MG/ML IJ SOLN
INTRAMUSCULAR | Status: AC
  Filled 2015-01-23: qty 1

## 2015-01-23 MED ORDER — FENTANYL CITRATE (PF) 100 MCG/2ML IJ SOLN: INTRAMUSCULAR | Status: DC | PRN

## 2015-01-23 MED ORDER — DIPHENHYDRAMINE HCL 12.5 MG/5ML PO ELIX: 12.5000 mg | ORAL_SOLUTION | Freq: Four times a day (QID) | ORAL | Status: DC | PRN

## 2015-01-23 MED ORDER — BUPIVACAINE LIPOSOME 1.3 % IJ SUSP
20.0000 mL | Freq: Once | INTRAMUSCULAR | Status: AC
  Administered 2015-01-23: 8 mL
  Filled 2015-01-23: qty 20

## 2015-01-23 MED ORDER — GLYCOPYRROLATE 0.2 MG/ML IJ SOLN
INTRAMUSCULAR | Status: AC
  Filled 2015-01-23: qty 5

## 2015-01-23 MED ORDER — ONDANSETRON HCL 4 MG/2ML IJ SOLN
INTRAMUSCULAR | Status: DC | PRN
  Administered 2015-01-23: 4 mg via INTRAVENOUS

## 2015-01-23 MED ORDER — NALOXONE HCL 0.4 MG/ML IJ SOLN: 0.4000 mg | INTRAMUSCULAR | Status: DC | PRN

## 2015-01-23 MED ORDER — PROPOFOL 10 MG/ML IV BOLUS
INTRAVENOUS | Status: DC | PRN
  Administered 2015-01-23: 150 mg via INTRAVENOUS
  Administered 2015-01-23: 30 mg via INTRAVENOUS

## 2015-01-23 MED ORDER — METOCLOPRAMIDE HCL 10 MG PO TABS
10.0000 mg | ORAL_TABLET | Freq: Three times a day (TID) | ORAL | Status: DC
  Administered 2015-01-24 (×2): 10 mg via ORAL
  Filled 2015-01-23 (×2): qty 1

## 2015-01-23 MED ORDER — ESMOLOL HCL 10 MG/ML IV SOLN
INTRAVENOUS | Status: AC
  Filled 2015-01-23: qty 10

## 2015-01-23 MED ORDER — ONDANSETRON HCL 4 MG/2ML IJ SOLN: 4.0000 mg | Freq: Four times a day (QID) | INTRAMUSCULAR | Status: DC | PRN

## 2015-01-23 MED ORDER — PROPOFOL 10 MG/ML IV BOLUS
INTRAVENOUS | Status: AC
  Filled 2015-01-23: qty 20

## 2015-01-23 MED ORDER — ONDANSETRON HCL 4 MG/2ML IJ SOLN
INTRAMUSCULAR | Status: AC
  Filled 2015-01-23: qty 2

## 2015-01-23 MED ORDER — HYDROMORPHONE HCL 1 MG/ML IJ SOLN
0.2500 mg | INTRAMUSCULAR | Status: DC | PRN
  Administered 2015-01-23 (×4): 0.5 mg via INTRAVENOUS

## 2015-01-23 MED ORDER — MIDAZOLAM HCL 2 MG/2ML IJ SOLN
INTRAMUSCULAR | Status: DC | PRN
  Administered 2015-01-23: 2 mg via INTRAVENOUS

## 2015-01-23 MED ORDER — ESMOLOL HCL 10 MG/ML IV SOLN
INTRAVENOUS | Status: DC | PRN
  Administered 2015-01-23: 30 mg via INTRAVENOUS

## 2015-01-23 MED ORDER — ACETAMINOPHEN 160 MG/5ML PO SOLN
ORAL | Status: AC
  Administered 2015-01-23: 975 mg via ORAL
  Filled 2015-01-23: qty 40.6

## 2015-01-23 MED ORDER — DEXAMETHASONE SODIUM PHOSPHATE 10 MG/ML IJ SOLN
INTRAMUSCULAR | Status: AC
  Filled 2015-01-23: qty 1

## 2015-01-23 MED ORDER — FENTANYL CITRATE (PF) 250 MCG/5ML IJ SOLN
INTRAMUSCULAR | Status: DC | PRN
  Administered 2015-01-23: 100 ug via INTRAVENOUS
  Administered 2015-01-23: 150 ug via INTRAVENOUS
  Administered 2015-01-23 (×2): 50 ug via INTRAVENOUS

## 2015-01-23 MED ORDER — LACTATED RINGERS IV SOLN
INTRAVENOUS | Status: DC
  Administered 2015-01-23 (×5): via INTRAVENOUS

## 2015-01-23 MED ORDER — LIDOCAINE HCL (CARDIAC) 20 MG/ML IV SOLN
INTRAVENOUS | Status: AC
  Filled 2015-01-23: qty 5

## 2015-01-23 MED ORDER — FENTANYL CITRATE (PF) 250 MCG/5ML IJ SOLN
INTRAMUSCULAR | Status: AC
  Filled 2015-01-23: qty 5

## 2015-01-23 MED ORDER — OXYCODONE-ACETAMINOPHEN 5-325 MG PO TABS
1.0000 | ORAL_TABLET | ORAL | Status: DC | PRN
  Administered 2015-01-24 (×2): 1 via ORAL
  Filled 2015-01-23 (×2): qty 1

## 2015-01-23 MED ORDER — NEOSTIGMINE METHYLSULFATE 10 MG/10ML IV SOLN
INTRAVENOUS | Status: AC
  Filled 2015-01-23: qty 1

## 2015-01-23 MED ORDER — NEOSTIGMINE METHYLSULFATE 10 MG/10ML IV SOLN
INTRAVENOUS | Status: DC | PRN
  Administered 2015-01-23: 4 mg via INTRAVENOUS

## 2015-01-23 MED ORDER — ROCURONIUM BROMIDE 100 MG/10ML IV SOLN
INTRAVENOUS | Status: AC
  Filled 2015-01-23: qty 1

## 2015-01-23 MED ORDER — MIDAZOLAM HCL 2 MG/2ML IJ SOLN
INTRAMUSCULAR | Status: AC
  Filled 2015-01-23: qty 2

## 2015-01-23 MED ORDER — SCOPOLAMINE 1 MG/3DAYS TD PT72
MEDICATED_PATCH | TRANSDERMAL | Status: AC
  Administered 2015-01-23: 1.5 mg via TRANSDERMAL
  Filled 2015-01-23: qty 1

## 2015-01-23 MED ORDER — HYDROMORPHONE HCL 1 MG/ML IJ SOLN
INTRAMUSCULAR | Status: DC | PRN
  Administered 2015-01-23: 1 mg via INTRAVENOUS

## 2015-01-23 MED ORDER — LACTATED RINGERS IR SOLN
Status: DC | PRN
Start: 2015-01-23 — End: 2015-01-23
  Administered 2015-01-23: 3000 mL

## 2015-01-23 MED ORDER — FENTANYL CITRATE (PF) 100 MCG/2ML IJ SOLN
INTRAMUSCULAR | Status: AC
  Filled 2015-01-23: qty 2

## 2015-01-23 MED ORDER — ROCURONIUM BROMIDE 100 MG/10ML IV SOLN
INTRAVENOUS | Status: DC | PRN
  Administered 2015-01-23: 10 mg via INTRAVENOUS
  Administered 2015-01-23: 35 mg via INTRAVENOUS
  Administered 2015-01-23 (×2): 5 mg via INTRAVENOUS
  Administered 2015-01-23: 20 mg via INTRAVENOUS
  Administered 2015-01-23: 10 mg via INTRAVENOUS

## 2015-01-23 MED ORDER — DIPHENHYDRAMINE HCL 50 MG/ML IJ SOLN: 12.5000 mg | Freq: Four times a day (QID) | INTRAMUSCULAR | Status: DC | PRN

## 2015-01-23 MED ORDER — PHENYLEPHRINE HCL 10 MG/ML IJ SOLN
INTRAMUSCULAR | Status: DC | PRN
  Administered 2015-01-23 (×2): 80 ug via INTRAVENOUS

## 2015-01-23 MED ORDER — LIDOCAINE HCL (CARDIAC) 20 MG/ML IV SOLN
INTRAVENOUS | Status: DC | PRN
  Administered 2015-01-23: 60 mg via INTRAVENOUS

## 2015-01-23 MED ORDER — ACETAMINOPHEN 160 MG/5ML PO SOLN
975.0000 mg | Freq: Once | ORAL | Status: AC
  Administered 2015-01-23: 975 mg via ORAL

## 2015-01-23 MED ORDER — DEXAMETHASONE SODIUM PHOSPHATE 4 MG/ML IJ SOLN
INTRAMUSCULAR | Status: DC | PRN
  Administered 2015-01-23: 4 mg via INTRAVENOUS

## 2015-01-23 MED ORDER — LIDOCAINE-EPINEPHRINE 1 %-1:100000 IJ SOLN
INTRAMUSCULAR | Status: AC
  Filled 2015-01-23: qty 1

## 2015-01-23 MED ORDER — SCOPOLAMINE 1 MG/3DAYS TD PT72
1.0000 | MEDICATED_PATCH | Freq: Once | TRANSDERMAL | Status: DC
  Administered 2015-01-23: 1.5 mg via TRANSDERMAL

## 2015-01-23 MED ORDER — LACTATED RINGERS IV SOLN
INTRAVENOUS | Status: DC
Start: 2015-01-23 — End: 2015-01-24
  Administered 2015-01-23 – 2015-01-24 (×2): via INTRAVENOUS

## 2015-01-23 MED ORDER — DEXTROSE 5 % IV SOLN: 2.0000 g | INTRAVENOUS | Status: DC

## 2015-01-23 MED ORDER — MORPHINE SULFATE 1 MG/ML IV SOLN
INTRAVENOUS | Status: DC
  Administered 2015-01-23: 14:00:00 via INTRAVENOUS
  Administered 2015-01-23: 6 mg via INTRAVENOUS
  Administered 2015-01-23: 11 mg via INTRAVENOUS
  Administered 2015-01-24: 4 mg via INTRAVENOUS
  Administered 2015-01-24: 5.9 mg via INTRAVENOUS
  Administered 2015-01-24: 12 mg via INTRAVENOUS
  Administered 2015-01-24: 4 mg via INTRAVENOUS
  Filled 2015-01-23 (×2): qty 25

## 2015-01-23 MED ORDER — BUPIVACAINE HCL (PF) 0.25 % IJ SOLN
INTRAMUSCULAR | Status: AC
  Filled 2015-01-23: qty 60

## 2015-01-23 MED ORDER — GLYCOPYRROLATE 0.2 MG/ML IJ SOLN
INTRAMUSCULAR | Status: DC | PRN
  Administered 2015-01-23: 0.1 mg via INTRAVENOUS
  Administered 2015-01-23: .8 mg via INTRAVENOUS

## 2015-01-23 SURGICAL SUPPLY — 61 items
BARRIER ADHS 3X4 INTERCEED (GAUZE/BANDAGES/DRESSINGS) IMPLANT
CABLE HIGH FREQUENCY MONO STRZ (ELECTRODE) IMPLANT
CLOTH BEACON ORANGE TIMEOUT ST (SAFETY) ×3 IMPLANT
CONT PATH 16OZ SNAP LID 3702 (MISCELLANEOUS) ×3 IMPLANT
COVER BACK TABLE 60X90IN (DRAPES) ×3 IMPLANT
COVER MAYO STAND STRL (DRAPES) ×3 IMPLANT
DECANTER SPIKE VIAL GLASS SM (MISCELLANEOUS) ×9 IMPLANT
DRSG COVADERM PLUS 2X2 (GAUZE/BANDAGES/DRESSINGS) ×6 IMPLANT
DRSG OPSITE POSTOP 3X4 (GAUZE/BANDAGES/DRESSINGS) IMPLANT
DRSG OPSITE POSTOP 4X12 (GAUZE/BANDAGES/DRESSINGS) ×3 IMPLANT
DURAPREP 26ML APPLICATOR (WOUND CARE) ×3 IMPLANT
ELECT REM PT RETURN 9FT ADLT (ELECTROSURGICAL) ×3
ELECTRODE REM PT RTRN 9FT ADLT (ELECTROSURGICAL) ×2 IMPLANT
EVACUATOR SMOKE 8.L (FILTER) ×3 IMPLANT
FILTER SMOKE EVAC LAPAROSHD (FILTER) ×3 IMPLANT
GLOVE BIOGEL PI IND STRL 7.0 (GLOVE) ×4 IMPLANT
GLOVE BIOGEL PI IND STRL 8 (GLOVE) ×2 IMPLANT
GLOVE BIOGEL PI INDICATOR 7.0 (GLOVE) ×2
GLOVE BIOGEL PI INDICATOR 8 (GLOVE) ×1
GLOVE ECLIPSE 7.5 STRL STRAW (GLOVE) ×6 IMPLANT
GOWN STRL REUS W/TWL LRG LVL3 (GOWN DISPOSABLE) ×21 IMPLANT
LIQUID BAND (GAUZE/BANDAGES/DRESSINGS) ×3 IMPLANT
NS IRRIG 1000ML POUR BTL (IV SOLUTION) ×3 IMPLANT
PACK LAPAROSCOPY BASIN (CUSTOM PROCEDURE TRAY) ×3 IMPLANT
PACK LAVH (CUSTOM PROCEDURE TRAY) ×3 IMPLANT
PACK ROBOTIC GOWN (GOWN DISPOSABLE) ×3 IMPLANT
PAD POSITIONER PINK NONSTERILE (MISCELLANEOUS) ×3 IMPLANT
POUCH SPECIMEN RETRIEVAL 10MM (ENDOMECHANICALS) IMPLANT
PROTECTOR NERVE ULNAR (MISCELLANEOUS) ×3 IMPLANT
SET CYSTO W/LG BORE CLAMP LF (SET/KITS/TRAYS/PACK) IMPLANT
SET IRRIG TUBING LAPAROSCOPIC (IRRIGATION / IRRIGATOR) ×3 IMPLANT
SHEARS HARMONIC ACE PLUS 36CM (ENDOMECHANICALS) ×3 IMPLANT
SLEEVE XCEL OPT CAN 5 100 (ENDOMECHANICALS) ×3 IMPLANT
SOLUTION ELECTROLUBE (MISCELLANEOUS) IMPLANT
SPONGE GAUZE 4X4 12PLY STER LF (GAUZE/BANDAGES/DRESSINGS) ×3 IMPLANT
STRIP CLOSURE SKIN 1/4X3 (GAUZE/BANDAGES/DRESSINGS) IMPLANT
SUT SILK 2 0 SH (SUTURE) ×3 IMPLANT
SUT VIC AB 0 CT1 18XCR BRD8 (SUTURE) ×4 IMPLANT
SUT VIC AB 0 CT1 27 (SUTURE)
SUT VIC AB 0 CT1 27XBRD ANBCTR (SUTURE) IMPLANT
SUT VIC AB 0 CT1 36 (SUTURE) ×3 IMPLANT
SUT VIC AB 0 CT1 8-18 (SUTURE) ×2
SUT VIC AB 2-0 SH 27 (SUTURE) ×2
SUT VIC AB 2-0 SH 27XBRD (SUTURE) ×4 IMPLANT
SUT VIC AB 3-0 CT1 27 (SUTURE) ×1
SUT VIC AB 3-0 CT1 TAPERPNT 27 (SUTURE) ×2 IMPLANT
SUT VIC AB 3-0 PS2 18 (SUTURE) ×1
SUT VIC AB 3-0 PS2 18XBRD (SUTURE) ×2 IMPLANT
SUT VIC AB 3-0 SH 27 (SUTURE) ×1
SUT VIC AB 3-0 SH 27X BRD (SUTURE) ×2 IMPLANT
SUT VICRYL 0 TIES 12 18 (SUTURE) ×3 IMPLANT
SUT VICRYL 0 UR6 27IN ABS (SUTURE) ×3 IMPLANT
SUT VICRYL RAPIDE 3 0 (SUTURE) ×6 IMPLANT
SYR BULB IRRIGATION 50ML (SYRINGE) ×3 IMPLANT
TOWEL OR 17X24 6PK STRL BLUE (TOWEL DISPOSABLE) ×6 IMPLANT
TRAY FOLEY CATH SILVER 14FR (SET/KITS/TRAYS/PACK) ×3 IMPLANT
TROCAR BALLN 12MMX100 BLUNT (TROCAR) IMPLANT
TROCAR XCEL NON-BLD 11X100MML (ENDOMECHANICALS) ×3 IMPLANT
TROCAR XCEL NON-BLD 5MMX100MML (ENDOMECHANICALS) ×3 IMPLANT
WARMER LAPAROSCOPE (MISCELLANEOUS) ×3 IMPLANT
WATER STERILE IRR 1000ML POUR (IV SOLUTION) ×3 IMPLANT

## 2015-01-23 NOTE — Interval H&P Note (Signed)
History and Physical Interval Note:  01/23/2015 8:48 AM  Helen Walker  has presented today for surgery, with the diagnosis of pelvic pain, dysfunctional uterine bleeding, endometrial polyp, fibroid uterus  The various methods of treatment have been discussed with the patient and family. After consideration of risks, benefits and other options for treatment, the patient has consented to  Procedure(s) with comments: [place] (N/A) - request to follow first case  requests 2 1/2 hours OR time.  needs cystoscope and harmonic scalpel LAPAROSCOPIC BILATERAL SALPINGECTOMY (Bilateral) as a surgical intervention .  The patient's history has been reviewed, patient examined, no change in status, stable for surgery.  I have reviewed the patient's chart and labs.  Questions were answered to the patient's satisfaction.     Terrance Mass

## 2015-01-23 NOTE — H&P (View-Only) (Signed)
Helen Walker is an 49 y.o. female who presented to the office today for preoperative exam. Patient scheduled for laparoscopic-assisted vaginal hysterectomy with bilateral salpingectomy and cystoscopy next week as a result of her progressive dysmenorrhea menorrhagia and intermenstrual bleeding. Patient is a gravida 3 para 2 Ab1 (1 ectopic, 2 cesarean sections, bilateral tubal ligation) who was seen as a new patient to the practice on 12/07/2014.She was previously being followed by Dr. Glennon Mac more on October 2015 that her annual gynecological examination. Patient reports normal Pap smears in the past. She is here because for the past 6 months she has been spotting in between cycles. She also has complained at times of vaginal dryness. Some vasomotor symptoms reported. She also states in 2007 she had an endometrial ablation/NovaSure technique. Also review of her record indicated in 2014 she had a transurethral resection of a bladder mass which was reported as benign smooth muscle. Her mammogram was this year which was reported to be normal. An attempted endometrial biopsy was done at that office visit and we were not able to enter the cavity completely. The little sample that was obtained was needed for histological evaluation and pathology report demonstrated with benign endocervical cells. No endometrial tissue.  An ultrasound and attempted sonohysterogram on June 15 of this year demonstrated the following: Uterus measured 9.9 x 5.5 x 4.9 cm with endometrial stripe at 2.4 mm. Patient had 2 intramural fibroids the largest one measuring 37 x 27 mm. Fluid pocket measuring 10 x 3 mm was noted in the myometrium. Right left ovary were normal. After was inserted cleansed with Betadine solution a catheter was able to be filled and at least half of the cavity and normal saline was instilled and a 10 x 4 intrauterine defect was noted lower endometrial segment. However part of the uterus difficult to  evaluate.  She was prescribed Megace 40 mg one by mouth twice a day to help stop her bleeding had demonstrated hemoglobin 13.1 and hematocrit 30.6 and a platelet count 391,000.  Pertinent Gynecological History: Menses: Intermenstrual bleeding with extensive cramping Bleeding: intermenstrual bleeding Contraception: tubal ligation DES exposure: unknown Blood transfusions: none Sexually transmitted diseases: HSV  Previous GYN Procedures: 2 C-sections, tubal ligation, endometrial ablation, ectopic Last mammogram: normal Date: 2016st pap: normal Date: 2015  History: G3 P2A1   Menstrual History: Menarche age:  27 Patient's last menstrual period was 11/16/2014 (approximate).    Past Medical History  Diagnosis Date  . Bladder mass 2013  . Medical history non-contributory     Past Surgical History  Procedure Laterality Date  . Cesarean section  [place]  . Tubal ligation  1995  . Hysteroscopy w/ endometrial ablation  07-17-2006  . Cystoscopy  07/11/2011    Procedure: CYSTOSCOPY;  Surgeon: Hanley Ben, MD;  Location: [facility] [place];  Service: Urology;  Laterality: N/A;  TUR Bladder Mass  . Transurethral resection of bladder tumor  07/11/2011    Procedure: TRANSURETHRAL RESECTION OF BLADDER TUMOR (TURBT);  Surgeon: Hanley Ben, MD;  Location: [facility];  Service: Urology;  Laterality: N/A;  . Cystoscopy w/ retrogrades  07/11/2011    Procedure: CYSTOSCOPY WITH RETROGRADE PYELOGRAM;  Surgeon: Hanley Ben, MD;  Location: [place];  Service: Urology;  Laterality: Bilateral;  retrograde of bladder    Family History  Problem Relation Age of Onset  . Hypertension Mother   . Diabetes Mother     Social History:  reports that she has  never smoked. She has never used smokeless tobacco. She reports that she drinks alcohol. She reports that she does not use illicit drugs.  Allergies: No Known Allergies   (Not in a hospital  admission)  REVIEW OF SYSTEMS: A ROS was performed and pertinent positives and negatives are included in the history.  GENERAL: No fevers or chills. HEENT: No change in vision, no earache, sore throat or sinus congestion. NECK: No pain or stiffness. CARDIOVASCULAR: No chest pain or pressure. No palpitations. PULMONARY: No shortness of breath, cough or wheeze. GASTROINTESTINAL: No abdominal pain, nausea, vomiting or diarrhea, melena or bright red blood per rectum. GENITOURINARY: No urinary frequency, urgency, hesitancy or dysuria. MUSCULOSKELETAL: No joint or muscle pain, no back pain, no recent trauma. DERMATOLOGIC: No rash, no itching, no lesions. ENDOCRINE: No polyuria, polydipsia, no heat or cold intolerance. No recent change in weight. HEMATOLOGICAL: No anemia or easy bruising or bleeding. NEUROLOGIC: No headache, seizures, numbness, tingling or weakness. PSYCHIATRIC: No depression, no loss of interest in normal activity or change in sleep pattern.     Blood pressure 136/78, height 5\' 6"  (1.676 m), weight 152 lb (68.947 kg), last menstrual period 11/16/2014.  Physical Exam:  HEENT:unremarkable Neck:Supple, midline, no thyroid megaly, no carotid bruits Lungs:  Clear to auscultation no rhonchi's or wheezes Heart:Regular rate and rhythm, no murmurs or gallops Breast ExSymmetrical in appearance no palpable masses or tenderness no supraclavicular axillary lymphadenopathy Abdomen:Soft nontender no rebound or guarding Pfannenstiel scar present subumbilical scar present BUS within normal limits  Vagina:no lesions or discharge, brown stained dark blood Cervix no lesions or discharge  Uterus:Anteverted 8-10 weeks size Adnexa: No palpable masses or tenderness  Extremities: No cords, no edema Rectal : Not examined   Results for orders placed or performed during the hospital encounter of 01/16/15 (from the past 24 hour(s))  CBC     Status: None   Collection Time: 01/16/15  2:25 PM  Result Value  Ref Range   WBC 8.9 4.0 - 10.5 K/uL   RBC 4.14 3.87 - 5.11 MIL/uL   Hemoglobin 12.3 12.0 - 15.0 g/dL   HCT 36.5 36.0 - 46.0 %   MCV 88.2 78.0 - 100.0 fL   MCH 29.7 26.0 - 34.0 pg   MCHC 33.7 30.0 - 36.0 g/dL   RDW 13.1 11.5 - 15.5 %   Platelets 325 150 - 400 K/uL  Type and screen     Status: None   Collection Time: 01/16/15  2:25 PM  Result Value Ref Range   ABO/RH(D) O POS    Antibody Screen NEG    Sample Expiration 01/30/2015     No results found.  [age]essmen52 year old patient with apparent post-ablation syndrome contributing to intermenstrual spotting and pelvic pain is scheduled to undergo laparoscopic-assisted vaginal hysterectomy with bilateral salpingectomy next week. The following risk were discussed:                         Patient was counseled as to the risk of surgery to include the following:  1. Infection (prohylactic antibiotics will be administered)  2. DVT/Pulmonary Embolism (prophylactic pneumo compression stockings will be used)  3.Trauma to internal organs requiring additional surgical procedure to repair any injury to     Internal organs requiring perhaps additional hospitalization days.  4.Hemmorhage requiring transfusion and blood products which carry risks such as             anaphylactic reaction, hepatitis and AIDS  Patient had received  literature information on the procedure scheduled and all her questions were answered and fully accepts all risk.  We discussed that we will do an examination under anesthesia of it feels that the uterus does not come down sufficiently or during laparoscopic evaluation that if we cannot do the surgery laparoscopically we'll have to do a laparotomy which will require her to be in the hospital for 2 days. She was provided with Pyridium 200 mg to take 1 by mouth 3 times a day the day before the procedure in the morning of the procedure since we are going to do a cystoscopy after the hysterectomy to confirm bilateral ureteral  patency. Also she was prescribed Percocet and Reglan for postop analgesia and and timed A.   FERNANDEZ,JUAN H 01/16/2015, 4:00 PM

## 2015-01-23 NOTE — Op Note (Signed)
PRE-OPERATIVE DIAGNOSIS: endometriosis  POST-OPERATIVE DIAGNOSIS:  Same  PROCEDURE:  Procedure(s): appendectomy  SURGEON:  Surgeon(s): Stark Klein, MD  ANESTHESIA:   general  DRAINS: none   LOCAL MEDICATIONS USED:  NONE  SPECIMEN:  Source of Specimen:  appendix  DISPOSITION OF SPECIMEN:  PATHOLOGY  COUNTS:  Not performed until after I left.    DICTATION: .Note written in EPIC  PLAN OF CARE: Admit to inpatient   PATIENT DISPOSITION:  left in OR with Dr. Toney Rakes  FINDINGS:  Mild secondary inflammation from endometriosis  EBL: min  PROCEDURE:  I was called for intraoperative consult for patient with endometriosis getting lap converted to open hysterectomy.  The patient had secondary inflammation of the appendix which was adherent to the pelvic sidewall.  I was requested to remove the appendix.    I identified the mesoappendix and tied it off with a 2-0 vicryl tie.  The appendix was clamped off and cut in between.  The mucosal lining was burned.  The appendix was tied off with a 2-0 silk stitch.  It was then inverted with a pursestring suture.  There was no evidence of bleeding.  The patient was left with Dr. Toney Rakes to complete the operation.

## 2015-01-23 NOTE — Anesthesia Procedure Notes (Signed)
Procedure Name: Intubation Date/Time: 01/23/2015 9:30 AM Performed by: Flossie Dibble Pre-anesthesia Checklist: Patient identified, Timeout performed, Emergency Drugs available, Suction available and Patient being monitored Patient Re-evaluated:Patient Re-evaluated prior to inductionOxygen Delivery Method: Circle system utilized Preoxygenation: Pre-oxygenation with 100% oxygen Intubation Type: IV induction Ventilation: Oral airway inserted - appropriate to patient size and Mask ventilation without difficulty Laryngoscope Size: Mac and 3 Grade View: Grade I Tube type: Oral Tube size: 7.0 mm Number of attempts: 1 Airway Equipment and Method: Stylet Placement Confirmation: ETT inserted through vocal cords under direct vision,  positive ETCO2 and breath sounds checked- equal and bilateral Secured at: 21 cm Tube secured with: Tape Dental Injury: Teeth and Oropharynx as per pre-operative assessment

## 2015-01-23 NOTE — Anesthesia Postprocedure Evaluation (Signed)
  Anesthesia Post-op Note  Patient: Helen Walker  Procedure(s) Performed: Procedure(s) with comments:  total abdominal hysterectomy , right salpingectomy oophorectomy (N/A) - request to follow first case  requests 2 1/2 hours OR time.  needs cystoscope and harmonic scalpel APPENDECTOMY (N/A) LAPAROSCOPIC LYSIS OF ADHESIONS (N/A) Patient is awake and responsive. Pain and nausea are reasonably well controlled. Vital signs are stable and clinically acceptable. Oxygen saturation is clinically acceptable. There are no apparent anesthetic complications at this time. Patient is ready for discharge.

## 2015-01-23 NOTE — Op Note (Signed)
Operative Note  01/23/2015  1:55 PM  PATIENT:  Helen Walker  49 y.o. female  PRE-OPERATIVE DIAGNOSIS:  pelvic pain, dysfunctional uterine bleeding, endometrial polyp, fibroid uterus  POST-OPERATIVE DIAGNOSIS:  pelvic pain, dysfunctional uterine bleeding, endometrial polyp, abdominal pelvic adhesions, endometriosis, inflamed appendix  PROCEDURE:  Procedure(s):  #1. Laparoscopy #2 laparotomy #3 lysis of abdominal pelvic adhesions #4 total abdominal hysterectomy with right salpingo-oophorectomy #5 appendectomy/general surgeon consult Dr. Janace Hoard  SURGEON:  Surgeon(s): Terrance Mass, MD Anastasio Auerbach, MD  Dr. Janace Hoard  ANESTHESIA:   general  FINDINGS: #1. Extensive abdominal pelvic adhesions #2 inflamed appendix #3 endometriosis of right pelvic sidewall #4 intramural leiomyoma  DESCRIPTION OF OPERATION: The patient was taken to the operating room where she underwent successful general endotracheal anesthesia. A timeout was undertaken to properly identify the patient and procedure to be performed. Patient had received 2 g of Cefotan for prophylaxis and also had PAS stockings for DVT prophylaxis. The abdomen vagina and perineum were prepped and draped in the usual sterile fashion. A Foley catheter was inserted to monitor urinary output. After the drapes were in place a small subumbilical incision was made followed by insertion of the Optiview 10/11 trocar. 3 L of carbon dioxide were inflated into the abdominal cavity. It was noted at this time that there was extensive abdominal pelvic adhesions, inflamed appendix, the right adnexa with evidence of endometriosis and appendix tip adhered to the right adnexa near the fundus of the uterus. It was at this time that it was decided to proceed with an open laparotomy and consult general surgeon for appendectomy. The trocar was removed and the sub-umbilicus fascia was closed with 0 Vicryls suture and the skin was reapproximated with Dermabond  glue. Following this a Pfannenstiel skin incision was made 2 cm above the symphysis pubis. The incision was carried down to the rectus fascia were by midline nick was made in the incision was extended in a transverse fashion. The peritoneal cavity was entered cautiously. The patient was then placed in the Trendelenburg position. Meticulous abdominal pelvic adhesive lysis was required before placing the Fredia Sorrow retractor for exposure. It was noted at this time that the tip of the appendix was adhered to the right fundal region of the uterus and evidence of endometriosis. Once this was freed attention the hysterectomy was undertaken the following fashion:  Two large Kelly clamps were placed above the cornual portion of the uterus, and the hysterectomy was begun by transecting the round ligaments, which were subsequently suture ligated with 0 Vicryl suture and tag. Uterovesical peritoneum was subsequently incised with the Metzenbaum scissors, and incision was extended to the sidewall on both side allowing dissection to the sidewall and subsequent visualization of the  ureters. Once the uterus was felt free, the infundibulopelvic ligaments were then grasped with 2 curved Haney clamps, subsequently cut and the pedicle was initially free tied with 0 Vicryl and subsequently suture ligated with 0 Vicryl suture and effort to remove the right tube and ovary which were inflamed and surrounded by endometriosis.. Attention was then turned to the contralateral round ligament, which was also secured in the previous fashion. The left infundibulopelvic ligament was clamped with 2 Heaney clamps hugging the uterus transected and free tied with 0 Vicryls suture followed by transfixation stitch the sleeping the left ovary very little fallopian tube was present probably from prior tubal ligation at time of cesarean section. Cardinal ligaments on either side were subsequently grasped, cut and suture ligated with  0 Vicryl  suture to the level of the uterosacral ligaments, which were then grasped with curved Haney clamps, cut and suture ligated with 0 Vicryl suture. These sutures were left long and tagged with hemostats. Subsequently, 2 large clamps were placed about the upper vaginal cuff. The specimen was subsequently dissected free with the Jorgenson scissors, and the pedicles were suture ligated with 0 Vicryl suture. The vaginal cuff was also closed with 0 Vicryl suture in an interrupted fashion. Subsequently, the pelvis was irrigated until clear. Points of bleeding were secured either by Bovie coagulation or by suture ligature with a 0 Vicryl suture.  Dr. Janace Hoard a general surgeon was consulted and proceeded with my assistance to perform the appendectomy which will be reported in a separate operative note.   Surgicel was placed at the vaginal cuff for additional hemostasis. Once hemostasis was achieved, the O'Connor-O'Sullivan retractor was subsequently removed. .Laps were subsequently removed and the abdominal wall was closed in the following fashion. The fascial layers were approximated with 0 Vicryl with sutures coming from the closing angles interlocking every third towards the midline. The subcutaneous layer was reapproximated with 3-0 Vicryl suture. The skin edges were reapproximated with staples. For postoperative analgesia Exparel 1.3% was infiltrated at the level of the peritoneum, fascia and subcutaneous for a total of 20 cc.The patient tolerated the procedure well. The uterus and cervix  call with right tube and ovary and appendix were passed off the operative field for histological evaluation. The patient was extubated and transferred to recovery room stable vital signs. She received Toradol 30 mg IV in route to the recovery room.      ESTIMATED BLOOD LOSS: 300 cc   Intake/Output Summary (Last 24 hours) at 01/23/15 1355 Last data filed at 01/23/15 1322  Gross per 24 hour  Intake   4800 ml  Output    900  ml  Net   3900 ml     BLOOD ADMINISTERED:none   LOCAL MEDICATIONS USED:  OTHER Exparel 1.3 % applied to all tissue levels upon closing for a total 20 cc  SPECIMEN:  Source of Specimen:  Uterus, cervix, right tube and ovary, appendix  DISPOSITION OF SPECIMEN:  PATHOLOGY  COUNTS:  YES  PLAN OF CARE: Transfer to [facility] HMD1:55 PMTD@

## 2015-01-23 NOTE — Transfer of Care (Signed)
Immediate Anesthesia Transfer of Care Note  Patient: Helen Walker  Procedure(s) Performed: Procedure(s) with comments: LAPAROSCOPIc total abdominal hysterectomy , right salpingectomy oophorectomy (N/A) - request to follow first case  requests 2 1/2 hours OR time.  needs cystoscope and harmonic scalpel APPENDECTOMY (N/A) LAPAROSCOPIC LYSIS OF ADHESIONS (N/A)  Patient Location: PACU  Anesthesia Type:General  Level of Consciousness: awake, alert , oriented and patient cooperative  Airway & Oxygen Therapy: Patient Spontanous Breathing and Patient connected to nasal cannula oxygen  Post-op Assessment: Report given to RN and Post -op Vital signs reviewed and stable  Post vital signs: Reviewed and stable  Last Vitals:  Filed Vitals:   01/23/15 0808  BP: 110/74  Pulse: 94  Temp: 36.7 C  Resp: 16    Complications: No apparent anesthesia complications

## 2015-01-24 ENCOUNTER — Encounter (HOSPITAL_COMMUNITY): Payer: Self-pay | Admitting: Gynecology

## 2015-01-24 ENCOUNTER — Institutional Professional Consult (permissible substitution): Payer: BLUE CROSS/BLUE SHIELD | Admitting: Gynecology

## 2015-01-24 LAB — CBC
HCT: 31.5 % — ABNORMAL LOW (ref 36.0–46.0)
Hemoglobin: 10.6 g/dL — ABNORMAL LOW (ref 12.0–15.0)
MCH: 29.5 pg (ref 26.0–34.0)
MCHC: 33.7 g/dL (ref 30.0–36.0)
MCV: 87.7 fL (ref 78.0–100.0)
Platelets: 313 10*3/uL (ref 150–400)
RBC: 3.59 MIL/uL — ABNORMAL LOW (ref 3.87–5.11)
RDW: 13 % (ref 11.5–15.5)
WBC: 12.3 10*3/uL — ABNORMAL HIGH (ref 4.0–10.5)

## 2015-01-24 MED ORDER — DOCUSATE SODIUM 100 MG PO CAPS: 100.0000 mg | ORAL_CAPSULE | Freq: Every day | ORAL | Status: DC

## 2015-01-24 NOTE — Progress Notes (Signed)
Discharge instructions reviewed with patient.  Patient states understanding of home care, medications, activity, signs/symptoms to report to MD and return MD office visit.  Patients significant other and family will assist with her care @ home.  No home  equipment needed, patient has prescriptions and all personal belongings.  Patient discharged per wheelchair in stable condition with staff without incident.  

## 2015-01-24 NOTE — Progress Notes (Signed)
1 Day Post-Op Procedure(s) (LRB):  total abdominal hysterectomy , right salpingectomy oophorectomy (N/A) APPENDECTOMY (N/A) LAPAROSCOPIC LYSIS OF ADHESIONS (N/A)  Subjective: Patient reports tolerating PO.    Objective: I have reviewed patient's vital signs, intake and output, medications and labs.  Preoperative hemoglobin 12.3 postop hemoglobin 10.6 Vital signs stable afebrile Incisional site intact  General: alert and cooperative Resp: clear to auscultation bilaterally Cardio: regular rate and rhythm, S1, S2 normal, no murmur, click, rub or gallop GI: soft, non-tender; bowel sounds normal; no masses,  no organomegaly Extremities: extremities normal, atraumatic, no cyanosis or edema Vaginal Bleeding: none  Assessment: s/p : stable and progressing well, status post total abdominal hysterectomy right salpingo-oophorectomy lysis of abdominal pelvic adhesions and appendectomy  Plan: Advance diet Encourage ambulation Advance to PO medication Hep-Lock IV when tolerating diet  Plan discharge home later this afternoon  LOS: 1 day    [facility] 01/24/2015, 8:10 AM

## 2015-01-24 NOTE — Discharge Summary (Signed)
Physician Discharge Summary  Patient ID: MAKINNA ANDY MRN: [id number] DOB/AGE: 1966-05-30 49 y.o.  Admit date: 01/23/2015 Discharge date: 01/24/2015  Admission Diagnoses: Leiomyomatous uteri, pelvic pain  Discharge Diagnoses: Leiomyomatous uteri, pelvic pain, abdominal pelvic adhesions    Discharged Condition: good  Hospital Course: Patient was admitted to the hospital 01/24/2015 where she underwent laparoscopy, laparotomy, lysis of abdominal pelvic adhesions along with total abdominal hysterectomy with right salpingo-oophorectomy along with surgical consultation with general surgeon Dr. Janace Hoard who performed an appendectomy. Patient did well intraoperatively and postoperatively. This morning her Foley catheter was discontinued after her overnight adequate urinary output and clear. Since then she has been voiding spontaneously and tolerating her regular diet. Patient has not passed flatus yet. She has remained afebrile with stable vital signs and rated be discharged home today.  Consults: general surgery intraoperative/appendectomy as a result of inflamed appendix  Significant Diagnostic Studies: labs: Preoperative hemoglobin 12.3 postop hemoglobin 10.6  Treatments: surgery: Laparoscopy, laparotomy, abdominal pelvic adhesive lysis, total abdominal hysterectomy right salpingo-oophorectomy and appendectomy  Discharge Exam: Blood pressure 116/67, pulse 75, temperature 98.5 F (36.9 C), temperature source Oral, resp. rate 18, height 5\' 7"  (1.702 m), weight 148 lb (67.132 kg), last menstrual period 11/13/2014, SpO2 99 %. General appearance: alert and cooperative Resp: clear to auscultation bilaterally GI: soft, non-tender; bowel sounds normal; no masses,  no organomegaly Extremities: extremities normal, atraumatic, no cyanosis or edema Incision/Wound: intact  Disposition: 01-Home or Self Care  Discharge Instructions    Call MD for:  redness, tenderness, or signs of infection (pain,  swelling, bleeding, redness, odor or green/yellow discharge around incision site)    Complete by:  As directed      Call MD for:  severe or increased pain, loss or decreased feeling  in affected limb(s)    Complete by:  As directed      Call MD for:  temperature >100.5    Complete by:  As directed      Discharge instructions    Complete by:  As directed   Office will call you tomorrow to schedule appointment to have staple removed. Hysterectomy, Abdominal & Vaginal Care After Refer to this sheet in the next few weeks. These discharge instructions provide you with general information on caring for yourself after you leave the hospital. Your caregiver may also give you specific instructions. Your treatment has been planned according to the most current medical practices available, but unavoidable complications sometimes occur. If you have any problems or questions after discharge, please call your caregiver. HOME CARE INSTRUCTIONS Healing will take time. You will have tenderness at the surgery site. There may be some swelling and bruising around this area if you had an abdominal hysterectomy. Have an adult stay with you the first 48 to 72 hours after surgery, and then for 1 to 2 weeks afterward to help with daily activities. Only take over-the-counter or prescription medicines for pain, discomfort, or fever as directed by your caregiver.  Do not take aspirin. It can cause bleeding.  Do not drive when taking pain medicine.  It will be normal to be sore for a couple weeks after surgery. See your caregiver if this seems to be getting worse rather than better.  Follow your caregiver's advice regarding diet, exercise, lifting, driving, and general activities.  Take showers instead of baths for a few weeks as directed.  You may resume your usual diet as directed.  Get plenty of rest and sleep.  Do not douche, use tampons, or  have sexual intercourse until your caregiver says it is okay.  Change your  bandages (dressings) as directed if you had an abdominal hysterectomy.  Take your temperature twice a day and write it down.  Do not drink alcohol until your caregiver says it is okay.  If you develop constipation, you may take a mild laxative with your caregiver's permission. Eating bran foods helps with constipation problems. Drink enough water and fluids to keep your urine clear or pale yellow.  Do not sign any legal documents until you feel normal again.  Keep all of your follow-up appointments.  Make sure you and your family understand everything about your operation and recovery.  SEEK MEDICAL CARE IF: There is swelling, redness, or increasing pain in the wound area.  Fluid (pus) is coming from the wound.  You notice a bad smell coming from the wound or surgical dressing.  You have pain, redness, and swelling from the intravenous (IV) site.  The wound breaks open.  You feel dizzy.  You develop pain or bleeding when you urinate.  You develop diarrhea.  You feel sick to your stomach (nauseous) and throw up (vomit).  You develop abnormal vaginal discharge.  You develop a rash.  You have any type of abnormal reaction or develop an allergy to your medicine.  Your pain medicine is not relieving your pain.  seek immediate medical care if: You have an oral temperature above 100, not controlled by medicine. HYYou develop chest pain.  You develop shortness of breath.  You pass out (faint).  You develop pain, swelling, or redness of your leg.  You develop heavy vaginal bleeding, with or without blood clots.  MAKE SURE YOU: Understand these instructions.  Will watch your condition.  Will get help right away if you are not doing well or get worse.  Document Released: 09/06/2003 Document Re-Released: 12/04/2009 [facility] Patient Information 2011 [place].   [facility] HMD5:33 PMTD@     Driving Restrictions    Complete by:  As directed   No driving for 1 week     Lifting  restrictions    Complete by:  As directed   No lifting for 6 weeks     Resume previous diet    Complete by:  As directed             Medication List    STOP taking these medications        megestrol 40 MG tablet  Commonly known as:  MEGACE     phenazopyridine 200 MG tablet  Commonly known as:  PYRIDIUM     traMADol 50 MG tablet  Commonly known as:  ULTRAM      TAKE these medications        metoCLOPramide 10 MG tablet  Commonly known as:  REGLAN  Take 1 tablet (10 mg total) by mouth 3 (three) times daily with meals.     multivitamin tablet  Take 1 tablet by mouth daily.     oxyCODONE-acetaminophen 5-325 MG per tablet  Commonly known as:  PERCOCET  Take 1 tablet by mouth every 4 (four) hours as needed for severe pain.         SignedTerrance Mass 01/24/2015, 5:34 PM

## 2015-01-30 ENCOUNTER — Encounter: Payer: Self-pay | Admitting: Women's Health

## 2015-01-30 ENCOUNTER — Ambulatory Visit (INDEPENDENT_AMBULATORY_CARE_PROVIDER_SITE_OTHER): Payer: BLUE CROSS/BLUE SHIELD | Admitting: Women's Health

## 2015-01-30 VITALS — BP 112/76

## 2015-01-30 DIAGNOSIS — Z4802 Encounter for removal of sutures: Secondary | ICD-10-CM

## 2015-01-30 NOTE — Progress Notes (Signed)
Patient ID: Helen Walker, female   DOB: 1965/10/30, 49 y.o.   MRN: [id number] Presents for staple removal, TAH with RSO/appendectomy for menorrhagia per Dr. Toney Rakes 01/23/2015. States is urinating without difficulty, has had 2 bowel movements since surgery and passing flatus.  Exam: Appears well. Staples intact, well approximated. Mild tenderness along well approximated incisional line. Area cleansed with alcohol, staples removed, benzoin applied and Steri-Strips.  One-week postop TAH - doing well  Plan: Continue to increase activity, avoid lifting greater than 10 pounds. Planning to return to work in 58 weeks. Keep scheduled follow-up with Dr. Toney Rakes.

## 2015-02-06 ENCOUNTER — Ambulatory Visit (INDEPENDENT_AMBULATORY_CARE_PROVIDER_SITE_OTHER): Payer: BLUE CROSS/BLUE SHIELD | Admitting: Gynecology

## 2015-02-06 ENCOUNTER — Encounter: Payer: Self-pay | Admitting: Gynecology

## 2015-02-06 VITALS — BP 116/78

## 2015-02-06 DIAGNOSIS — N898 Other specified noninflammatory disorders of vagina: Secondary | ICD-10-CM

## 2015-02-06 DIAGNOSIS — N9489 Other specified conditions associated with female genital organs and menstrual cycle: Secondary | ICD-10-CM

## 2015-02-06 DIAGNOSIS — R102 Pelvic and perineal pain: Secondary | ICD-10-CM

## 2015-02-06 DIAGNOSIS — Z09 Encounter for follow-up examination after completed treatment for conditions other than malignant neoplasm: Secondary | ICD-10-CM

## 2015-02-06 LAB — URINALYSIS W MICROSCOPIC + REFLEX CULTURE
Bacteria, UA: NONE SEEN [HPF]
Bilirubin Urine: NEGATIVE
Casts: NONE SEEN [LPF]
Crystals: NONE SEEN [HPF]
Glucose, UA: NEGATIVE
Hgb urine dipstick: NEGATIVE
Ketones, ur: NEGATIVE
Leukocytes, UA: NEGATIVE
Nitrite: NEGATIVE
Protein, ur: NEGATIVE
RBC / HPF: NONE SEEN RBC/HPF (ref <=2)
Specific Gravity, Urine: 1.025 (ref 1.001–1.035)
WBC, UA: NONE SEEN WBC/HPF (ref <=5)
Yeast: NONE SEEN [HPF]
pH: 5.5 (ref 5.0–8.0)

## 2015-02-06 LAB — WET PREP FOR TRICH, YEAST, CLUE
Trich, Wet Prep: NONE SEEN
Yeast Wet Prep HPF POC: NONE SEEN

## 2015-02-06 MED ORDER — PHENAZOPYRIDINE HCL 200 MG PO TABS: 200.0000 mg | ORAL_TABLET | Freq: Three times a day (TID) | ORAL | Status: DC | PRN

## 2015-02-06 MED ORDER — TINIDAZOLE 500 MG PO TABS: ORAL_TABLET | ORAL | Status: DC

## 2015-02-06 NOTE — Patient Instructions (Signed)
Tinidazole tablets What is this medicine? TINIDAZOLE (tye NI da zole) is an antiinfective. It is used to treat amebiasis, giardiasis, trichomoniasis, and vaginosis. It will not work for colds, flu, or other viral infections. This medicine may be used for other purposes; ask your health care provider or pharmacist if you have questions. COMMON BRAND NAME(S): Tindamax What should I tell my health care provider before I take this medicine? They need to know if you have any of these conditions: -anemia or other blood disorders -if you frequently drink alcohol containing drinks -receiving hemodialysis -seizure disorder -an unusual or allergic reaction to tinidazole, other medicines, foods, dyes, or preservatives -pregnant or trying to get pregnant -breast-feeding How should I use this medicine? Take this medicine by mouth with a full glass of water. Follow the directions on the prescription label. Take with food. Take your medicine at regular intervals. Do not take your medicine more often than directed. Take all of your medicine as directed even if you think you are better. Do not skip doses or stop your medicine early. Talk to your pediatrician regarding the use of this medicine in children. While this drug may be prescribed for children as young as 3 years of age for selected conditions, precautions do apply. Overdosage: If you think you have taken too much of this medicine contact a poison control center or emergency room at once. NOTE: This medicine is only for you. Do not share this medicine with others. What if I miss a dose? If you miss a dose, take it as soon as you can. If it is almost time for your next dose, take only that dose. Do not take double or extra doses. What may interact with this medicine? Do not take this medicine with any of the following medications: -alcohol or any product that contains alcohol -amprenavir oral solution -disulfiram -paclitaxel injection -ritonavir  oral solution -sertraline oral solution -sulfamethoxazole-trimethoprim injection This medicine may also interact with the following medications: -cholestyramine -cimetidine -conivaptan -cyclosporin -fluorouracil -fosphenytoin, phenytoin -ketoconazole -lithium -phenobarbital -tacrolimus -warfarin This list may not describe all possible interactions. Give your health care provider a list of all the medicines, herbs, non-prescription drugs, or dietary supplements you use. Also tell them if you smoke, drink alcohol, or use illegal drugs. Some items may interact with your medicine. What should I watch for while using this medicine? Tell your doctor or health care professional if your symptoms do not improve or if they get worse. Avoid alcoholic drinks while you are taking this medicine and for three days afterward. Alcohol may make you feel dizzy, sick, or flushed. If you are being treated for a sexually transmitted disease, avoid sexual contact until you have finished your treatment. Your sexual partner may also need treatment. What side effects may I notice from receiving this medicine? Side effects that you should report to your doctor or health care professional as soon as possible: -allergic reactions like skin rash, itching or hives, swelling of the face, lips, or tongue -breathing problems -confusion, depression -dark or white patches in the mouth -feeling faint or lightheaded, falls -fever, infection -numbness, tingling, pain or weakness in the hands or feet -pain when passing urine -seizures -unusually weak or tired -vaginal irritation or discharge -vomiting Side effects that usually do not require medical attention (report to your doctor or health care professional if they continue or are bothersome): -dark brown or reddish urine -diarrhea -headache -loss of appetite -metallic taste -nausea -stomach upset This list may not describe all  possible side effects. Call your  doctor for medical advice about side effects. You may report side effects to FDA at 1-800-FDA-1088. Where should I keep my medicine? Keep out of the reach of children. Store at room temperature between 15 and 30 degrees C (59 and 86 degrees F). Protect from light and moisture. Keep container tightly closed. Throw away any unused medicine after the expiration date. NOTE: This sheet is a summary. It may not cover all possible information. If you have questions about this medicine, talk to your doctor, pharmacist, or health care provider.  2015, Elsevier/Gold Standard. (2008-03-13 15:22:28) Bacterial Vaginosis Bacterial vaginosis is a vaginal infection that occurs when the normal balance of bacteria in the vagina is disrupted. It results from an overgrowth of certain bacteria. This is the most common vaginal infection in women of childbearing age. Treatment is important to prevent complications, especially in pregnant women, as it can cause a premature delivery. CAUSES  Bacterial vaginosis is caused by an increase in harmful bacteria that are normally present in smaller amounts in the vagina. Several different kinds of bacteria can cause bacterial vaginosis. However, the reason that the condition develops is not fully understood. RISK FACTORS Certain activities or behaviors can put you at an increased risk of developing bacterial vaginosis, including:  Having a new sex partner or multiple sex partners.  Douching.  Using an intrauterine device (IUD) for contraception. Women do not get bacterial vaginosis from toilet seats, bedding, swimming pools, or contact with objects around them. SIGNS AND SYMPTOMS  Some women with bacterial vaginosis have no signs or symptoms. Common symptoms include:  Grey vaginal discharge.  A fishlike odor with discharge, especially after sexual intercourse.  Itching or burning of the vagina and vulva.  Burning or pain with urination. DIAGNOSIS  Your health care  provider will take a medical history and examine the vagina for signs of bacterial vaginosis. A sample of vaginal fluid may be taken. Your health care provider will look at this sample under a microscope to check for bacteria and abnormal cells. A vaginal pH test may also be done.  TREATMENT  Bacterial vaginosis may be treated with antibiotic medicines. These may be given in the form of a pill or a vaginal cream. A second round of antibiotics may be prescribed if the condition comes back after treatment.  HOME CARE INSTRUCTIONS   Only take over-the-counter or prescription medicines as directed by your health care provider.  If antibiotic medicine was prescribed, take it as directed. Make sure you finish it even if you start to feel better.  Do not have sex until treatment is completed.  Tell all sexual partners that you have a vaginal infection. They should see their health care provider and be treated if they have problems, such as a mild rash or itching.  Practice safe sex by using condoms and only having one sex partner. SEEK MEDICAL CARE IF:   Your symptoms are not improving after 3 days of treatment.  You have increased discharge or pain.  You have a fever. MAKE SURE YOU:   Understand these instructions.  Will watch your condition.  Will get help right away if you are not doing well or get worse. FOR MORE INFORMATION  Centers for Disease Control and Prevention, Division of STD Prevention: AppraiserFraud.fi American Sexual Health Association (ASHA): www.ashastd.org  Document Released: 06/16/2005 Document Revised: 04/06/2013 Document Reviewed: 01/26/2013 [facility] Patient Information 2015 [place], [place]. This information is not intended to replace advice given to you  by your health care provider. Make sure you discuss any questions you have with your health care provider.

## 2015-02-06 NOTE — Addendum Note (Signed)
Addended by: Thurnell Garbe A on: 02/06/2015 03:04 PM   Modules accepted: Orders

## 2015-02-06 NOTE — Progress Notes (Signed)
   Patient presented to the office for her two-week postop visit she has done well with exception by 2 or 3 days ago she felt some pelvic pressure point urinating some dysuria but no fever chills, nausea or vomiting. The following findings from surgery as well as pathology report and pictures were shared with the patient:  PRE-OPERATIVE DIAGNOSIS: pelvic pain, dysfunctional uterine bleeding, endometrial polyp, fibroid uterus  POST-OPERATIVE DIAGNOSIS: pelvic pain, dysfunctional uterine bleeding, endometrial polyp, abdominal pelvic adhesions, endometriosis, inflamed appendix  PROCEDURE: Procedure(s): #1. Laparoscopy #2 laparotomy #3 lysis of abdominal pelvic adhesions #4 total abdominal hysterectomy with right salpingo-oophorectomy #5 appendectomy/general surgeon consult Dr. Janace Hoard  SURGEON: Surgeon(s): Terrance Mass, MD Anastasio Auerbach, MD  Dr. Janace Hoard  ANESTHESIA: general  FINDINGS: #1. Extensive abdominal pelvic adhesions #2 inflamed appendix #3 endometriosis of right pelvic sidewall #4 intramural leiomyoma  Pathology report demonstrated the following: Diagnosis 1. Uterus and cervix, with right fallopian tube and ovary - BENIGN UTERINE LEIOMYOMA (4.0 CM). - BENIGN ENDOMETRIAL GLANDS AND STROMA; NEGATIVE FOR HYPERPLASIA OR MALIGNANCY. - BENIGN UTERINE ADENOMYOSIS; NEGATIVE FOR ATYPIA OR MALIGNANCY. - BENIGN CERVICAL MUCOSA; NEGATIVE FOR INTRAEPITHELIAL LESION OR MALIGNANCY. - UNREMARKABLE UTERINE SEROSA. - BENIGN RIGHT OVARY; NEGATIVE FOR ATYPIA OR MALIGNANCY. - BENIGN PARATUBAL ADHESIONS AND ENDOMETRIOTIC IMPLANT; NEGATIVE FOR ATYPIA OR MALIGNANCY. 2. Appendix, Other than Incidental - BENIGN FIBROUS OBLITERATION OF APPENDIX.  Preoperative hemoglobin 12.3 postop hemoglobin 10.6  Exam: Blood pressure 116/78 Gen. appearance: Well developed well nourished female in no acute distress only of the above-mentioned complaint of pelvic pressure urination Abdomen:  Pfannenstiel scar completely healed Steri-Strips still present soft nontender no rebound or guarding Pelvic: Bartholin urethra Skene was within normal limits Vagina: Slight creamy discharge was noted wet prep was obtained Vaginal cuff: Intact Bimanual exam no palpable masses or tenderness Rectal exam not done  Wet prep: Few clue cells moderate WBC too numerous to count bacteria  Urinalysis: Urine negative  Assessment/plan: Patient 2 weeks status post total abdominal hysterectomy right salpingo-oophorectomy lysis of abdominal pelvic adhesion and appendectomy with clinical evidence of bacterial vaginosis will be prescribed Tindamax 500 mg 4 tablets today and repeat 4 tablets tomorrow. We'll prescribe Pyridium 200 mg to take 1 by mouth 3 times a day for the next 3 days for bladder spasm. Although the urine was negative we'll run it for culture. Patient to return back in 4 weeks for postop visit.

## 2015-02-07 LAB — URINE CULTURE
Colony Count: NO GROWTH
Organism ID, Bacteria: NO GROWTH

## 2015-02-13 ENCOUNTER — Other Ambulatory Visit: Payer: Self-pay

## 2015-02-19 ENCOUNTER — Telehealth: Payer: Self-pay

## 2015-02-19 NOTE — Telephone Encounter (Signed)
Patient called stating she needed a letter faxed to employer stating that her return to work date will be extended by a few days as her last post operative visit has been scheduled for 9.9.16. Letter faxed to employer and copy is in Epic Chart.

## 2015-03-09 ENCOUNTER — Encounter: Payer: Self-pay | Admitting: Gynecology

## 2015-03-09 ENCOUNTER — Ambulatory Visit (INDEPENDENT_AMBULATORY_CARE_PROVIDER_SITE_OTHER): Payer: BLUE CROSS/BLUE SHIELD | Admitting: Gynecology

## 2015-03-09 VITALS — BP 116/72

## 2015-03-09 DIAGNOSIS — D500 Iron deficiency anemia secondary to blood loss (chronic): Secondary | ICD-10-CM

## 2015-03-09 DIAGNOSIS — Z09 Encounter for follow-up examination after completed treatment for conditions other than malignant neoplasm: Secondary | ICD-10-CM

## 2015-03-09 LAB — CBC WITH DIFFERENTIAL/PLATELET
Basophils Absolute: 0.1 10*3/uL (ref 0.0–0.1)
Basophils Relative: 1 % (ref 0–1)
Eosinophils Absolute: 0.1 10*3/uL (ref 0.0–0.7)
Eosinophils Relative: 1 % (ref 0–5)
HCT: 38 % (ref 36.0–46.0)
Hemoglobin: 12.9 g/dL (ref 12.0–15.0)
Lymphocytes Relative: 36 % (ref 12–46)
Lymphs Abs: 2.6 10*3/uL (ref 0.7–4.0)
MCH: 29.7 pg (ref 26.0–34.0)
MCHC: 33.9 g/dL (ref 30.0–36.0)
MCV: 87.4 fL (ref 78.0–100.0)
MPV: 8.4 fL — ABNORMAL LOW (ref 8.6–12.4)
Monocytes Absolute: 0.5 10*3/uL (ref 0.1–1.0)
Monocytes Relative: 7 % (ref 3–12)
Neutro Abs: 3.9 10*3/uL (ref 1.7–7.7)
Neutrophils Relative %: 55 % (ref 43–77)
Platelets: 408 10*3/uL — ABNORMAL HIGH (ref 150–400)
RBC: 4.35 MIL/uL (ref 3.87–5.11)
RDW: 14.1 % (ref 11.5–15.5)
WBC: 7.1 10*3/uL (ref 4.0–10.5)

## 2015-03-09 NOTE — Progress Notes (Signed)
   Patient presented to the office today for her six-week postop visit. Patient is asymptomatic and doing well.  The following findings from surgery as well as pathology report and pictures were shared with the patient:  PRE-OPERATIVE DIAGNOSIS: pelvic pain, dysfunctional uterine bleeding, endometrial polyp, fibroid uterus  POST-OPERATIVE DIAGNOSIS: pelvic pain, dysfunctional uterine bleeding, endometrial polyp, abdominal pelvic adhesions, endometriosis, inflamed appendix  PROCEDURE: Procedure(s): #1. Laparoscopy #2 laparotomy #3 lysis of abdominal pelvic adhesions #4 total abdominal hysterectomy with right salpingo-oophorectomy #5 appendectomy/general surgeon consult Dr. Janace Hoard  SURGEON: Surgeon(s): Terrance Mass, MD Anastasio Auerbach, MD  Dr. Janace Hoard  ANESTHESIA: general  FINDINGS: #1. Extensive abdominal pelvic adhesions #2 inflamed appendix #3 endometriosis of right pelvic sidewall #4 intramural leiomyoma  Pathology report demonstrated the following: Diagnosis 1. Uterus and cervix, with right fallopian tube and ovary - BENIGN UTERINE LEIOMYOMA (4.0 CM). - BENIGN ENDOMETRIAL GLANDS AND STROMA; NEGATIVE FOR HYPERPLASIA OR MALIGNANCY. - BENIGN UTERINE ADENOMYOSIS; NEGATIVE FOR ATYPIA OR MALIGNANCY. - BENIGN CERVICAL MUCOSA; NEGATIVE FOR INTRAEPITHELIAL LESION OR MALIGNANCY. - UNREMARKABLE UTERINE SEROSA. - BENIGN RIGHT OVARY; NEGATIVE FOR ATYPIA OR MALIGNANCY. - BENIGN PARATUBAL ADHESIONS AND ENDOMETRIOTIC IMPLANT; NEGATIVE FOR ATYPIA OR MALIGNANCY. 2. Appendix, Other than Incidental - BENIGN FIBROUS OBLITERATION OF APPENDIX.  Preoperative hemoglobin 12.3 postop hemoglobin 10.6  Patient currently taking one iron tablet daily  Exam: Abdomen: Soft nontender no rebound or guarding Pfannenstiel incision intact Pelvic: Bartholin urethra Skene was within normal limits Vagina: No lesions or discharge Vaginal cuff: Intact Bimanual exam no palpable masses or  tenderness Rectal exam not done  Assessment/plan: Patient 6 weeks status post total abdominal hysterectomy with right salpingo-oophorectomy, lysis of abdominal pelvic adhesions and appendectomy has recovered completely and may return to full normal activity. She'll return back to the office in one year for annual exam or when necessary. We will check her CBC. If hemoglobin back to normal she will discontinue her iron supplementation.

## 2015-04-16 ENCOUNTER — Ambulatory Visit: Payer: BC Managed Care – PPO | Admitting: Obstetrics & Gynecology

## 2015-07-10 ENCOUNTER — Ambulatory Visit (INDEPENDENT_AMBULATORY_CARE_PROVIDER_SITE_OTHER): Payer: BLUE CROSS/BLUE SHIELD | Admitting: Gynecology

## 2015-07-10 ENCOUNTER — Encounter: Payer: Self-pay | Admitting: Gynecology

## 2015-07-10 VITALS — BP 120/76

## 2015-07-10 DIAGNOSIS — A499 Bacterial infection, unspecified: Secondary | ICD-10-CM | POA: Diagnosis not present

## 2015-07-10 DIAGNOSIS — N760 Acute vaginitis: Secondary | ICD-10-CM | POA: Diagnosis not present

## 2015-07-10 DIAGNOSIS — M545 Low back pain, unspecified: Secondary | ICD-10-CM

## 2015-07-10 DIAGNOSIS — N898 Other specified noninflammatory disorders of vagina: Secondary | ICD-10-CM

## 2015-07-10 DIAGNOSIS — B9689 Other specified bacterial agents as the cause of diseases classified elsewhere: Secondary | ICD-10-CM

## 2015-07-10 DIAGNOSIS — B373 Candidiasis of vulva and vagina: Secondary | ICD-10-CM | POA: Diagnosis not present

## 2015-07-10 DIAGNOSIS — R42 Dizziness and giddiness: Secondary | ICD-10-CM

## 2015-07-10 DIAGNOSIS — B3731 Acute candidiasis of vulva and vagina: Secondary | ICD-10-CM

## 2015-07-10 DIAGNOSIS — R110 Nausea: Secondary | ICD-10-CM | POA: Diagnosis not present

## 2015-07-10 LAB — COMPREHENSIVE METABOLIC PANEL
ALT: 11 U/L (ref 6–29)
AST: 12 U/L (ref 10–35)
Albumin: 4 g/dL (ref 3.6–5.1)
Alkaline Phosphatase: 48 U/L (ref 33–115)
BUN: 15 mg/dL (ref 7–25)
CO2: 25 mmol/L (ref 20–31)
Calcium: 9 mg/dL (ref 8.6–10.2)
Chloride: 101 mmol/L (ref 98–110)
Creat: 0.85 mg/dL (ref 0.50–1.10)
Glucose, Bld: 81 mg/dL (ref 65–99)
Potassium: 3.9 mmol/L (ref 3.5–5.3)
Sodium: 134 mmol/L — ABNORMAL LOW (ref 135–146)
Total Bilirubin: 0.5 mg/dL (ref 0.2–1.2)
Total Protein: 7.2 g/dL (ref 6.1–8.1)

## 2015-07-10 LAB — CBC WITH DIFFERENTIAL/PLATELET
Basophils Absolute: 0 10*3/uL (ref 0.0–0.1)
Basophils Relative: 0 % (ref 0–1)
Eosinophils Absolute: 0.1 10*3/uL (ref 0.0–0.7)
Eosinophils Relative: 1 % (ref 0–5)
HCT: 37.7 % (ref 36.0–46.0)
Hemoglobin: 13.2 g/dL (ref 12.0–15.0)
Lymphocytes Relative: 38 % (ref 12–46)
Lymphs Abs: 3.6 10*3/uL (ref 0.7–4.0)
MCH: 30.2 pg (ref 26.0–34.0)
MCHC: 35 g/dL (ref 30.0–36.0)
MCV: 86.3 fL (ref 78.0–100.0)
MPV: 8.2 fL — ABNORMAL LOW (ref 8.6–12.4)
Monocytes Absolute: 0.9 10*3/uL (ref 0.1–1.0)
Monocytes Relative: 9 % (ref 3–12)
Neutro Abs: 4.9 10*3/uL (ref 1.7–7.7)
Neutrophils Relative %: 52 % (ref 43–77)
Platelets: 324 10*3/uL (ref 150–400)
RBC: 4.37 MIL/uL (ref 3.87–5.11)
RDW: 14.1 % (ref 11.5–15.5)
WBC: 9.5 10*3/uL (ref 4.0–10.5)

## 2015-07-10 LAB — URINALYSIS W MICROSCOPIC + REFLEX CULTURE
Bilirubin Urine: NEGATIVE
Casts: NONE SEEN [LPF]
Crystals: NONE SEEN [HPF]
Glucose, UA: NEGATIVE
Hgb urine dipstick: NEGATIVE
Leukocytes, UA: NEGATIVE
Nitrite: NEGATIVE
Protein, ur: NEGATIVE
RBC / HPF: NONE SEEN RBC/HPF (ref <=2)
Specific Gravity, Urine: 1.02 (ref 1.001–1.035)
WBC, UA: NONE SEEN WBC/HPF (ref <=5)
pH: 5 (ref 5.0–8.0)

## 2015-07-10 LAB — WET PREP FOR TRICH, YEAST, CLUE
Clue Cells Wet Prep HPF POC: NONE SEEN
Trich, Wet Prep: NONE SEEN

## 2015-07-10 MED ORDER — METRONIDAZOLE 500 MG PO TABS: 500.0000 mg | ORAL_TABLET | Freq: Two times a day (BID) | ORAL | Status: DC

## 2015-07-10 MED ORDER — FLUCONAZOLE 150 MG PO TABS: ORAL_TABLET | ORAL | Status: DC

## 2015-07-10 NOTE — Progress Notes (Signed)
   Patient is a 50 year old complaining of a vaginal discharge with odor for the past few days the discharge was described as wide in appearance. No pruritus. Mild back discomfort. She states she had some vomiting a few days ago. Patient with no dysuria, frequency or any bowel movement changes. Patient was last seen in the office September 2016 for final postop visit She was status post total abdominal hysterectomy with right salpingo-oophorectomy and appendectomy and has done well from her surgery.  Patient also had voice that the past few days when she changes position she gets lightheaded. Patient denies any palpitations no shortness of breath no numbness or tingling in her arms or years.  Exam: Blood pressure 120/76 Well developed well nourished female in no acute distress Neck supple trachea midline no carotid bruits no thyromegaly Lungs: Clear to auscultation Rogers or wheezes Heart: Regular rate and rhythm no murmurs or gallops Pelvic exam Bartholin urethra Skene was within normal limits Vagina: Thick white discharge noted vaginal cuff intact Bimanual exam unremarkable Rectal exam not done  Urinalysis: Few bacteria and few yeast culture pending  Wet prep moderate yeast, moderate WBC, many bacteria  Assessment/plan: Problem #1 we'll treat yeast infection and suspected BV following: Diflucan 150 mg 1 by mouth today repeat in 48 hours. Also Flagyl 500 mg twice a day for 5 days. Problem #2 will refer to medical colleague Dr. Aris [place] for further evaluation for her vertigo symptoms. Literature information was provided. She was reminded to maintain good hydration. We will check today CBC, hemoglobin A1c and conference metabolic panel.

## 2015-07-10 NOTE — Patient Instructions (Signed)
Benign Positional Vertigo Vertigo is the feeling that you or your surroundings are moving when they are not. Benign positional vertigo is the most common form of vertigo. The cause of this condition is not serious (is benign). This condition is triggered by certain movements and positions (is positional). This condition can be dangerous if it occurs while you are doing something that could endanger you or others, such as driving.  CAUSES In many cases, the cause of this condition is not known. It may be caused by a disturbance in an area of the inner ear that helps your brain to sense movement and balance. This disturbance can be caused by a viral infection (labyrinthitis), head injury, or repetitive motion. RISK FACTORS This condition is more likely to develop in:  Women.  People who are 50 years of age or older. SYMPTOMS Symptoms of this condition usually happen when you move your head or your eyes in different directions. Symptoms may start suddenly, and they usually last for less than a minute. Symptoms may include:  Loss of balance and falling.  Feeling like you are spinning or moving.  Feeling like your surroundings are spinning or moving.  Nausea and vomiting.  Blurred vision.  Dizziness.  Involuntary eye movement (nystagmus). Symptoms can be mild and cause only slight annoyance, or they can be severe and interfere with daily life. Episodes of benign positional vertigo may return (recur) over time, and they may be triggered by certain movements. Symptoms may improve over time. DIAGNOSIS This condition is usually diagnosed by medical history and a physical exam of the head, neck, and ears. You may be referred to a health care provider who specializes in ear, nose, and throat (ENT) problems (otolaryngologist) or a provider who specializes in disorders of the nervous system (neurologist). You may have additional testing, including:  MRI.  A CT scan.  Eye movement tests. Your  health care provider may ask you to change positions quickly while he or she watches you for symptoms of benign positional vertigo, such as nystagmus. Eye movement may be tested with an electronystagmogram (ENG), caloric stimulation, the Dix-Hallpike test, or the roll test.  An electroencephalogram (EEG). This records electrical activity in your brain.  Hearing tests. TREATMENT Usually, your health care provider will treat this by moving your head in specific positions to adjust your inner ear back to normal. Surgery may be needed in severe cases, but this is rare. In some cases, benign positional vertigo may resolve on its own in 2-4 weeks. HOME CARE INSTRUCTIONS Safety  Move slowly.Avoid sudden body or head movements.  Avoid driving.  Avoid operating heavy machinery.  Avoid doing any tasks that would be dangerous to you or others if a vertigo episode would occur.  If you have trouble walking or keeping your balance, try using a cane for stability. If you feel dizzy or unstable, sit down right away.  Return to your normal activities as told by your health care provider. Ask your health care provider what activities are safe for you. General Instructions  Take over-the-counter and prescription medicines only as told by your health care provider.  Avoid certain positions or movements as told by your health care provider.  Drink enough fluid to keep your urine clear or pale yellow.  Keep all follow-up visits as told by your health care provider. This is important. SEEK MEDICAL CARE IF:  You have a fever.  Your condition gets worse or you develop new symptoms.  Your family or friends   notice any behavioral changes.  Your nausea or vomiting gets worse.  You have numbness or a "pins and needles" sensation. SEEK IMMEDIATE MEDICAL CARE IF:  You have difficulty speaking or moving.  You are always dizzy.  You faint.  You develop severe headaches.  You have weakness in your  legs or arms.  You have changes in your hearing or vision.  You develop a stiff neck.  You develop sensitivity to light.   This information is not intended to replace advice given to you by your health care provider. Make sure you discuss any questions you have with your health care provider.   Document Released: 03/24/2006 Document Revised: 03/07/2015 Document Reviewed: 10/09/2014 Elsevier Interactive Patient Education 2016 [employer] Fluconazole tablets What is this medicine? FLUCONAZOLE (floo KON na zole) is an antifungal medicine. It is used to treat certain kinds of fungal or yeast infections. This medicine may be used for other purposes; ask your health care provider or pharmacist if you have questions. What should I tell my health care provider before I take this medicine? They need to know if you have any of these conditions: -electrolyte abnormalities -history of irregular heart beat -kidney disease -an unusual or allergic reaction to fluconazole, other azole antifungals, medicines, foods, dyes, or preservatives -pregnant or trying to get pregnant -breast-feeding How should I use this medicine? Take this medicine by mouth. Follow the directions on the prescription label. Do not take your medicine more often than directed. Talk to your pediatrician regarding the use of this medicine in children. Special care may be needed. This medicine has been used in children as young as 77 months of age. Overdosage: If you think you have taken too much of this medicine contact a poison control center or emergency room at once. NOTE: This medicine is only for you. Do not share this medicine with others. What if I miss a dose? If you miss a dose, take it as soon as you can. If it is almost time for your next dose, take only that dose. Do not take double or extra doses. What may interact with this medicine? Do not take this medicine with any of the following  medications: -astemizole -certain medicines for irregular heart beat like dofetilide, dronedarone, quinidine -cisapride -erythromycin -lomitapide -other medicines that prolong the QT interval (cause an abnormal heart rhythm) -pimozide -terfenadine -thioridazine -tolvaptan -ziprasidone This medicine may also interact with the following medications: -antiviral medicines for HIV or AIDS -birth control pills -certain antibiotics like rifabutin, rifampin -certain medicines for blood pressure like amlodipine, isradipine, felodipine, hydrochlorothiazide, losartan, nifedipine -certain medicines for cancer like cyclophosphamide, vinblastine, vincristine -certain medicines for cholesterol like atorvastatin, lovastatin, fluvastatin, simvastatin -certain medicines for depression, anxiety, or psychotic disturbances like amitriptyline, midazolam, nortriptyline, triazolam -certain medicines for diabetes like glipizide, glyburide, tolbutamide -certain medicines for pain like alfentanil, fentanyl, methadone -certain medicines for seizures like carbamazepine, phenytoin -certain medicines that treat or prevent blood clots like warfarin -halofantrine -medicines that lower your chance of fighting infection like cyclosporine, prednisone, tacrolimus -NSAIDS, medicines for pain and inflammation, like celecoxib, diclofenac, flurbiprofen, ibuprofen, meloxicam, naproxen -other medicines for fungal infections -sirolimus -theophylline -tofacitinib This list may not describe all possible interactions. Give your health care provider a list of all the medicines, herbs, non-prescription drugs, or dietary supplements you use. Also tell them if you smoke, drink alcohol, or use illegal drugs. Some items may interact with your medicine. What should I watch for while using this medicine? Visit your doctor or health  care professional for regular checkups. If you are taking this medicine for a long time you may need blood  work. Tell your doctor if your symptoms do not improve. Some fungal infections need many weeks or months of treatment to cure. Alcohol can increase possible damage to your liver. Avoid alcoholic drinks. If you have a vaginal infection, do not have sex until you have finished your treatment. You can wear a sanitary napkin. Do not use tampons. Wear freshly washed cotton, not synthetic, panties. What side effects may I notice from receiving this medicine? Side effects that you should report to your doctor or health care professional as soon as possible: -allergic reactions like skin rash or itching, hives, swelling of the lips, mouth, tongue, or throat -dark urine -feeling dizzy or faint -irregular heartbeat or chest pain -redness, blistering, peeling or loosening of the skin, including inside the mouth -trouble breathing -unusual bruising or bleeding -vomiting -yellowing of the eyes or skin Side effects that usually do not require medical attention (report to your doctor or health care professional if they continue or are bothersome): -changes in how food tastes -diarrhea -headache -stomach upset or nausea This list may not describe all possible side effects. Call your doctor for medical advice about side effects. You may report side effects to FDA at 1-800-FDA-1088. Where should I keep my medicine? Keep out of the reach of children. Store at room temperature below 30 degrees C (86 degrees F). Throw away any medicine after the expiration date. NOTE: This sheet is a summary. It may not cover all possible information. If you have questions about this medicine, talk to your doctor, pharmacist, or health care provider.    2016, Elsevier/Gold Standard. (2013-01-22 16:13:04) Metronidazole tablets or capsules What is this medicine? METRONIDAZOLE (me troe NI da zole) is an antiinfective. It is used to treat certain kinds of bacterial and protozoal infections. It will not work for colds, flu, or  other viral infections. This medicine may be used for other purposes; ask your health care provider or pharmacist if you have questions. What should I tell my health care provider before I take this medicine? They need to know if you have any of these conditions: -anemia or other blood disorders -disease of the nervous system -fungal or yeast infection -if you drink alcohol containing drinks -liver disease -seizures -an unusual or allergic reaction to metronidazole, or other medicines, foods, dyes, or preservatives -pregnant or trying to get pregnant -breast-feeding How should I use this medicine? Take this medicine by mouth with a full glass of water. Follow the directions on the prescription label. Take your medicine at regular intervals. Do not take your medicine more often than directed. Take all of your medicine as directed even if you think you are better. Do not skip doses or stop your medicine early. Talk to your pediatrician regarding the use of this medicine in children. Special care may be needed. Overdosage: If you think you have taken too much of this medicine contact a poison control center or emergency room at once. NOTE: This medicine is only for you. Do not share this medicine with others. What if I miss a dose? If you miss a dose, take it as soon as you can. If it is almost time for your next dose, take only that dose. Do not take double or extra doses. What may interact with this medicine? Do not take this medicine with any of the following medications: -alcohol or any product that contains alcohol -  amprenavir oral solution -cisapride -disulfiram -dofetilide -dronedarone -paclitaxel injection -pimozide -ritonavir oral solution -sertraline oral solution -sulfamethoxazole-trimethoprim injection -thioridazine -ziprasidone This medicine may also interact with the following medications: -birth control pills -cimetidine -lithium -other medicines that prolong the QT  interval (cause an abnormal heart rhythm) -phenobarbital -phenytoin -warfarin This list may not describe all possible interactions. Give your health care provider a list of all the medicines, herbs, non-prescription drugs, or dietary supplements you use. Also tell them if you smoke, drink alcohol, or use illegal drugs. Some items may interact with your medicine. What should I watch for while using this medicine? Tell your doctor or health care professional if your symptoms do not improve or if they get worse. You may get drowsy or dizzy. Do not drive, use machinery, or do anything that needs mental alertness until you know how this medicine affects you. Do not stand or sit up quickly, especially if you are an older patient. This reduces the risk of dizzy or fainting spells. Avoid alcoholic drinks while you are taking this medicine and for three days afterward. Alcohol may make you feel dizzy, sick, or flushed. If you are being treated for a sexually transmitted disease, avoid sexual contact until you have finished your treatment. Your sexual partner may also need treatment. What side effects may I notice from receiving this medicine? Side effects that you should report to your doctor or health care professional as soon as possible: -allergic reactions like skin rash or hives, swelling of the face, lips, or tongue -confusion, clumsiness -difficulty speaking -discolored or sore mouth -dizziness -fever, infection -numbness, tingling, pain or weakness in the hands or feet -trouble passing urine or change in the amount of urine -redness, blistering, peeling or loosening of the skin, including inside the mouth -seizures -unusually weak or tired -vaginal irritation, dryness, or discharge Side effects that usually do not require medical attention (report to your doctor or health care professional if they continue or are bothersome): -diarrhea -headache -irritability -metallic  taste -nausea -stomach pain or cramps -trouble sleeping This list may not describe all possible side effects. Call your doctor for medical advice about side effects. You may report side effects to FDA at 1-800-FDA-1088. Where should I keep my medicine? Keep out of the reach of children. Store at room temperature below 25 degrees C (77 degrees F). Protect from light. Keep container tightly closed. Throw away any unused medicine after the expiration date. NOTE: This sheet is a summary. It may not cover all possible information. If you have questions about this medicine, talk to your doctor, pharmacist, or health care provider.    2016, Elsevier/Gold Standard. (2013-01-21 14:08:39) Bacterial Vaginosis Bacterial vaginosis is a vaginal infection that occurs when the normal balance of bacteria in the vagina is disrupted. It results from an overgrowth of certain bacteria. This is the most common vaginal infection in women of childbearing age. Treatment is important to prevent complications, especially in pregnant women, as it can cause a premature delivery. CAUSES  Bacterial vaginosis is caused by an increase in harmful bacteria that are normally present in smaller amounts in the vagina. Several different kinds of bacteria can cause bacterial vaginosis. However, the reason that the condition develops is not fully understood. RISK FACTORS Certain activities or behaviors can put you at an increased risk of developing bacterial vaginosis, including:  Having a new sex partner or multiple sex partners.  Douching.  Using an intrauterine device (IUD) for contraception. Women do not get bacterial vaginosis from  toilet seats, bedding, swimming pools, or contact with objects around them. SIGNS AND SYMPTOMS  Some women with bacterial vaginosis have no signs or symptoms. Common symptoms include:  Grey vaginal discharge.  A fishlike odor with discharge, especially after sexual intercourse.  Itching or  burning of the vagina and vulva.  Burning or pain with urination. DIAGNOSIS  Your health care provider will take a medical history and examine the vagina for signs of bacterial vaginosis. A sample of vaginal fluid may be taken. Your health care provider will look at this sample under a microscope to check for bacteria and abnormal cells. A vaginal pH test may also be done.  TREATMENT  Bacterial vaginosis may be treated with antibiotic medicines. These may be given in the form of a pill or a vaginal cream. A second round of antibiotics may be prescribed if the condition comes back after treatment. Because bacterial vaginosis increases your risk for sexually transmitted diseases, getting treated can help reduce your risk for chlamydia, gonorrhea, HIV, and herpes. HOME CARE INSTRUCTIONS   Only take over-the-counter or prescription medicines as directed by your health care provider.  If antibiotic medicine was prescribed, take it as directed. Make sure you finish it even if you start to feel better.  Tell all sexual partners that you have a vaginal infection. They should see their health care provider and be treated if they have problems, such as a mild rash or itching.  During treatment, it is important that you follow these instructions:  Avoid sexual activity or use condoms correctly.  Do not douche.  Avoid alcohol as directed by your health care provider.  Avoid breastfeeding as directed by your health care provider. SEEK MEDICAL CARE IF:   Your symptoms are not improving after 3 days of treatment.  You have increased discharge or pain.  You have a fever. MAKE SURE YOU:   Understand these instructions.  Will watch your condition.  Will get help right away if you are not doing well or get worse. FOR MORE INFORMATION  Centers for Disease Control and Prevention, Division of STD Prevention: [employer] American Sexual Health Association (ASHA): www.ashastd.org    This  information is not intended to replace advice given to you by your health care provider. Make sure you discuss any questions you have with your health care provider.   Document Released: 06/16/2005 Document Revised: 07/07/2014 Document Reviewed: 01/26/2013 Elsevier Interactive Patient Education 2016 Elsevier Inc. Monilial Vaginitis Vaginitis in a soreness, swelling and redness (inflammation) of the vagina and vulva. Monilial vaginitis is not a sexually transmitted infection. CAUSES  Yeast vaginitis is caused by yeast (candida) that is normally found in your vagina. With a yeast infection, the candida has overgrown in number to a point that upsets the chemical balance. SYMPTOMS   White, thick vaginal discharge.  Swelling, itching, redness and irritation of the vagina and possibly the lips of the vagina (vulva).  Burning or painful urination.  Painful intercourse. DIAGNOSIS  Things that may contribute to monilial vaginitis are:  Postmenopausal and virginal states.  Pregnancy.  Infections.  Being tired, sick or stressed, especially if you had monilial vaginitis in the past.  Diabetes. Good control will help lower the chance.  Birth control pills.  Tight fitting garments.  Using bubble bath, feminine sprays, douches or deodorant tampons.  Taking certain medications that kill germs (antibiotics).  Sporadic recurrence can occur if you become ill. TREATMENT  Your caregiver will give you medication.  There are several kinds of  anti monilial vaginal creams and suppositories specific for monilial vaginitis. For recurrent yeast infections, use a suppository or cream in the vagina 2 times a week, or as directed.  Anti-monilial or steroid cream for the itching or irritation of the vulva may also be used. Get your caregiver's permission.  Painting the vagina with methylene blue solution may help if the monilial cream does not work.  Eating yogurt may help prevent monilial  vaginitis. HOME CARE INSTRUCTIONS   Finish all medication as prescribed.  Do not have sex until treatment is completed or after your caregiver tells you it is okay.  Take warm sitz baths.  Do not douche.  Do not use tampons, especially scented ones.  Wear cotton underwear.  Avoid tight pants and panty hose.  Tell your sexual partner that you have a yeast infection. They should go to their caregiver if they have symptoms such as mild rash or itching.  Your sexual partner should be treated as well if your infection is difficult to eliminate.  Practice safer sex. Use condoms.  Some vaginal medications cause latex condoms to fail. Vaginal medications that harm condoms are:  Cleocin cream.  Butoconazole (Femstat).  Terconazole (Terazol) vaginal suppository.  Miconazole (Monistat) (may be purchased over the counter). SEEK MEDICAL CARE IF:   You have a temperature by mouth above 102 F (38.9 C).  The infection is getting worse after 2 days of treatment.  The infection is not getting better after 3 days of treatment.  You develop blisters in or around your vagina.  You develop vaginal bleeding, and it is not your menstrual period.  You have pain when you urinate.  You develop intestinal problems.  You have pain with sexual intercourse.   This information is not intended to replace advice given to you by your health care provider. Make sure you discuss any questions you have with your health care provider.   Document Released: 03/26/2005 Document Revised: 09/08/2011 Document Reviewed: 12/18/2014 Elsevier Interactive Patient Education [employer].

## 2015-07-10 NOTE — Addendum Note (Signed)
Addended by: Thurnell Garbe A on: 07/10/2015 04:36 PM   Modules accepted: Orders

## 2015-07-11 LAB — HEMOGLOBIN A1C
Hgb A1c MFr Bld: 5.7 % — ABNORMAL HIGH (ref <5.7)
Mean Plasma Glucose: 117 mg/dL — ABNORMAL HIGH (ref <117)

## 2015-07-12 ENCOUNTER — Ambulatory Visit (INDEPENDENT_AMBULATORY_CARE_PROVIDER_SITE_OTHER): Payer: BLUE CROSS/BLUE SHIELD | Admitting: Family Medicine

## 2015-07-12 ENCOUNTER — Encounter: Payer: Self-pay | Admitting: Family Medicine

## 2015-07-12 VITALS — BP 116/70 | HR 68 | Temp 98.1°F | Wt 156.0 lb

## 2015-07-12 DIAGNOSIS — R42 Dizziness and giddiness: Secondary | ICD-10-CM

## 2015-07-12 DIAGNOSIS — A6000 Herpesviral infection of urogenital system, unspecified: Secondary | ICD-10-CM

## 2015-07-12 LAB — URINE CULTURE
Colony Count: NO GROWTH
Organism ID, Bacteria: NO GROWTH

## 2015-07-12 MED ORDER — MECLIZINE HCL 25 MG PO TABS: 25.0000 mg | ORAL_TABLET | Freq: Two times a day (BID) | ORAL | Status: DC | PRN

## 2015-07-12 MED ORDER — VALACYCLOVIR HCL 500 MG PO TABS: 500.0000 mg | ORAL_TABLET | Freq: Two times a day (BID) | ORAL | Status: DC

## 2015-07-12 NOTE — Patient Instructions (Signed)
Benign Positional Vertigo Vertigo is the feeling that you or your surroundings are moving when they are not. Benign positional vertigo is the most common form of vertigo. The cause of this condition is not serious (is benign). This condition is triggered by certain movements and positions (is positional). This condition can be dangerous if it occurs while you are doing something that could endanger you or others, such as driving.  CAUSES In many cases, the cause of this condition is not known. It may be caused by a disturbance in an area of the inner ear that helps your brain to sense movement and balance. This disturbance can be caused by a viral infection (labyrinthitis), head injury, or repetitive motion. RISK FACTORS This condition is more likely to develop in:  Women.  People who are 50 years of age or older. SYMPTOMS Symptoms of this condition usually happen when you move your head or your eyes in different directions. Symptoms may start suddenly, and they usually last for less than a minute. Symptoms may include:  Loss of balance and falling.  Feeling like you are spinning or moving.  Feeling like your surroundings are spinning or moving.  Nausea and vomiting.  Blurred vision.  Dizziness.  Involuntary eye movement (nystagmus). Symptoms can be mild and cause only slight annoyance, or they can be severe and interfere with daily life. Episodes of benign positional vertigo may return (recur) over time, and they may be triggered by certain movements. Symptoms may improve over time. DIAGNOSIS This condition is usually diagnosed by medical history and a physical exam of the head, neck, and ears. You may be referred to a health care provider who specializes in ear, nose, and throat (ENT) problems (otolaryngologist) or a provider who specializes in disorders of the nervous system (neurologist). You may have additional testing, including:  MRI.  A CT scan.  Eye movement tests. Your  health care provider may ask you to change positions quickly while he or she watches you for symptoms of benign positional vertigo, such as nystagmus. Eye movement may be tested with an electronystagmogram (ENG), caloric stimulation, the Dix-Hallpike test, or the roll test.  An electroencephalogram (EEG). This records electrical activity in your brain.  Hearing tests. TREATMENT Usually, your health care provider will treat this by moving your head in specific positions to adjust your inner ear back to normal. Surgery may be needed in severe cases, but this is rare. In some cases, benign positional vertigo may resolve on its own in 2-4 weeks. HOME CARE INSTRUCTIONS Safety  Move slowly.Avoid sudden body or head movements.  Avoid driving.  Avoid operating heavy machinery.  Avoid doing any tasks that would be dangerous to you or others if a vertigo episode would occur.  If you have trouble walking or keeping your balance, try using a cane for stability. If you feel dizzy or unstable, sit down right away.  Return to your normal activities as told by your health care provider. Ask your health care provider what activities are safe for you. General Instructions  Take over-the-counter and prescription medicines only as told by your health care provider.  Avoid certain positions or movements as told by your health care provider.  Drink enough fluid to keep your urine clear or pale yellow.  Keep all follow-up visits as told by your health care provider. This is important. SEEK MEDICAL CARE IF:  You have a fever.  Your condition gets worse or you develop new symptoms.  Your family or friends   notice any behavioral changes.  Your nausea or vomiting gets worse.  You have numbness or a "pins and needles" sensation. SEEK IMMEDIATE MEDICAL CARE IF:  You have difficulty speaking or moving.  You are always dizzy.  You faint.  You develop severe headaches.  You have weakness in your  legs or arms.  You have changes in your hearing or vision.  You develop a stiff neck.  You develop sensitivity to light.   This information is not intended to replace advice given to you by your health care provider. Make sure you discuss any questions you have with your health care provider.   Document Released: 03/24/2006 Document Revised: 03/07/2015 Document Reviewed: 10/09/2014 Elsevier Interactive Patient Education 2016 [employer] Inc.  

## 2015-07-12 NOTE — Progress Notes (Signed)
Subjective:    Patient ID: Helen Walker, female    DOB: 09/11/1965, 50 y.o.   MRN: [id number]  HPI Chief Complaint  Patient presents with  . new pt    new pt lightheaded since last thursday.    She is new to the practice and here for an acute complaint.  She complains of a one week history of intermittent dizziness that she describes as feeling unsteady and occurs when going from sitting to standing or lying down to standing. Today she had a dizzy episode when turning her head to the left abruptly. Dizziness last approximately 15 seconds and has occurred 5 or 6 times per day.  She does not report any significant health history. Taking Diflucan and metronidazole for vaginal infection.  Denies headache, vision changes, tinnitus, ear pain, sinus pressure, sore throat, chest pain, palpitations, shortness of breath, no GI or GU symptoms.  No recent illness.    Family history mother had breast cancer, heart attack, hyperlipidemia, HTN, diabetes and vertigo.   Surgeries include 2 c-sections, hysterectomy.  Does not smoke, drinks alcohol occasionally, denies drug use.   She is requesting refill of valacyclovir for genital herpes outbreaks.  Lives with boyfriend and 2 kids. 63 and 35 years old.  Works in Therap[employer], art.    Review of Systems Pertinent positives and negatives in the history of present illness.    Objective:   Physical Exam  Constitutional: She is oriented to person, place, and time. She appears well-developed and well-nourished. No distress.  HENT:  Right Ear: Hearing, tympanic membrane, external ear and ear canal normal.  Left Ear: Hearing, tympanic membrane, external ear and ear canal normal.  Nose: Nose normal.  Mouth/Throat: Uvula is midline, oropharynx is clear and moist and mucous membranes are normal.  Eyes: Conjunctivae and EOM are normal. Pupils are equal, round, and reactive to light.  Neck: Normal range of motion and full passive range of motion without  pain. Neck supple.  Cardiovascular: Normal rate, regular rhythm, normal heart sounds, intact distal pulses and normal pulses.   Pulmonary/Chest: Effort normal and breath sounds normal.  Lymphadenopathy:    She has no cervical adenopathy.  Neurological: She is alert and oriented to person, place, and time. She has normal strength and normal reflexes. No cranial nerve deficit or sensory deficit. She displays a negative Romberg sign. Coordination and gait normal.  Normal finger to nose and rapid alternating movements.   Skin: Skin is warm and dry. No rash noted. No cyanosis. No pallor.  Psychiatric: She has a normal mood and affect. Her speech is normal and behavior is normal. Judgment and thought content normal. Cognition and memory are normal.   BP 116/70 mmHg  Pulse 68  Temp(Src) 98.1 F (36.7 C) (Oral)  Wt 156 lb (70.761 kg)  LMP 11/16/2014 (Approximate)  Orthostatic vitals: Negative for postural.    Assessment & Plan:  Dizziness  Genital herpes  Discussed that her neurologic exam is completely normal and she is not orthostatic. Based on her history and physical, I suspect that her dizziness is related to positional vertigo and will treat her with meclizine. Discussed if she gets worse or notices any stroke-like symptoms that she should go to the emergency room. She will follow-up in 2 weeks. Recommend that she eat small frequent meals throughout the day and stay well-hydrated. She did have an elevated hemoglobin A1c at her gynecologist office. Discussed that it was 5.7 and that we will keep an eye on this.  Prescription for Valacyclovir sent to pharmacy.

## 2015-07-27 ENCOUNTER — Encounter: Payer: Self-pay | Admitting: Family Medicine

## 2015-07-27 ENCOUNTER — Ambulatory Visit (INDEPENDENT_AMBULATORY_CARE_PROVIDER_SITE_OTHER): Payer: BLUE CROSS/BLUE SHIELD | Admitting: Family Medicine

## 2015-07-27 ENCOUNTER — Other Ambulatory Visit: Payer: Self-pay

## 2015-07-27 VITALS — BP 118/68 | HR 64 | Wt 153.6 lb

## 2015-07-27 DIAGNOSIS — Z09 Encounter for follow-up examination after completed treatment for conditions other than malignant neoplasm: Secondary | ICD-10-CM | POA: Diagnosis not present

## 2015-07-27 DIAGNOSIS — Z1231 Encounter for screening mammogram for malignant neoplasm of breast: Secondary | ICD-10-CM

## 2015-07-27 NOTE — Progress Notes (Signed)
   Subjective:    Patient ID: Helen Walker, female    DOB: 1966/02/20, 50 y.o.   MRN: [id number]  HPI Chief Complaint  Patient presents with  . follow-up    follow-up on vertigo. feeling much better   She is here for follow up of dizziness. Her symptoms at her last visit strongly suggested vertigo She took meclizine x 1 day and has not had any episodes of dizziness since then.  She is eating healthy and lost 3 lbs. She is feeling well and plans to start exercising soon. She states she is working on getting her hemoglobin A1C down. 07/10/2015 it was 5.7 and we discussed pre-diabetes and lifestyle changes.    Review of Systems Pertinent positives and negatives in the history of present illness.     Objective:   Physical Exam BP 118/68 mmHg  Pulse 64  Wt 153 lb 9.6 oz (69.673 kg)  LMP 11/16/2014 (Approximate)  Alert and oriented in no acute distress. Not otherwise examined.     Assessment & Plan:  Follow up  She has had no additional dizzy episodes, no further evaluation or treatment recommended. Recommend she continue staying well hydrated and eating a healthy diet. Encouraged her to start exercising. We will follow up on her hemoglobin A1c in 3 months.

## 2015-08-20 ENCOUNTER — Ambulatory Visit: Payer: BLUE CROSS/BLUE SHIELD

## 2015-09-10 ENCOUNTER — Encounter: Payer: Self-pay | Admitting: Family Medicine

## 2015-09-10 ENCOUNTER — Ambulatory Visit (INDEPENDENT_AMBULATORY_CARE_PROVIDER_SITE_OTHER): Payer: BLUE CROSS/BLUE SHIELD | Admitting: Family Medicine

## 2015-09-10 VITALS — BP 120/70 | HR 60 | Temp 98.1°F | Wt 158.0 lb

## 2015-09-10 DIAGNOSIS — J069 Acute upper respiratory infection, unspecified: Secondary | ICD-10-CM | POA: Diagnosis not present

## 2015-09-10 DIAGNOSIS — R05 Cough: Secondary | ICD-10-CM

## 2015-09-10 DIAGNOSIS — R059 Cough, unspecified: Secondary | ICD-10-CM

## 2015-09-10 MED ORDER — AZITHROMYCIN 250 MG PO TABS: ORAL_TABLET | ORAL | Status: DC

## 2015-09-10 NOTE — Patient Instructions (Signed)
Try Mucinex DM or Delsym. Salt water gargles for your throat, stay well-hydrated, and also try humidifier if you have one. I am sending an antibiotic your pharmacy and if you are not continuing to improve in the next 2-3 days then start this. If you take the antibiotic and you're not 100% when he finish it,  let me know.

## 2015-09-10 NOTE — Progress Notes (Signed)
Subjective:  Helen Walker is a 50 y.o. female who presents for a 5-6 day history of productive cough, nasal congestion, and hoarseness. She just returned from a vacation to [place].   Denies fever, chills, body aches, ear pain, sore throat, nausea, vomiting, diarrhea. Does not smoke. Denies history of bronchitis or pneumonia.   Treatment to date: choraseptic spray, claritin.  Denies sick contacts.  No other aggravating or relieving factors.  No other c/o.  ROS as in subjective.   Objective: Filed Vitals:   09/10/15 1054  BP: 120/70  Pulse: 60  Temp: 98.1 F (36.7 C)    General appearance: Alert, WD/WN, no distress, mildly ill appearing                             Skin: warm, no rash                           Head: no sinus tenderness                            Eyes: conjunctiva normal, corneas clear, PERRLA                            Ears: pearly TMs, external ear canals normal                          Nose: septum midline, turbinates swollen, with erythema and clear discharge             Mouth/throat: MMM, tongue normal, mild pharyngeal erythema                           Neck: supple, no adenopathy, no thyromegaly, nontender                          Heart: RRR, normal S1, S2, no murmurs                         Lungs: CTA bilaterally, no wheezes, rales, or rhonchi     Assessment: Cough - Plan: azithromycin (ZITHROMAX Z-PAK) 250 MG tablet  Acute upper respiratory infection - Plan: azithromycin (ZITHROMAX Z-PAK) 250 MG tablet  Plan: Discussed diagnosis and treatment of URI.  Z-pak prescription sent to pharmacy with instructions to give her illness 2-3 more days and see if she improves on her own. Suggested symptomatic OTC remedies such as saltwater gargles for throat, and Mucinex DM for congestion, cough Nasal saline spray and humidifier also for congestion.  Tylenol or Ibuprofen OTC for fever and malaise.  Call/return if not back to baseline after completing the  antibiotic if she decides to take it.

## 2015-09-21 ENCOUNTER — Telehealth: Payer: Self-pay | Admitting: Family Medicine

## 2015-09-21 DIAGNOSIS — R059 Cough, unspecified: Secondary | ICD-10-CM

## 2015-09-21 DIAGNOSIS — J069 Acute upper respiratory infection, unspecified: Secondary | ICD-10-CM

## 2015-09-21 DIAGNOSIS — R05 Cough: Secondary | ICD-10-CM

## 2015-09-21 MED ORDER — AZITHROMYCIN 250 MG PO TABS: ORAL_TABLET | ORAL | Status: DC

## 2015-09-21 NOTE — Telephone Encounter (Signed)
Pt called and states that she  Has finished the antibiotic but is still coughing and is still coughing up green mucous,,  And was wondering if you would send another round of antibiotic in for her or what you would suggest. Pt can be reached at [phone] (M) and pt uses [id number] - [place], [place]

## 2015-09-21 NOTE — Telephone Encounter (Signed)
Pt states that she started antibiotic on 13th and finished on 17th. She is still having phelgm and coughing a lot. She is 25% better. No fever. Please send antibiotic in to pharmacy. She was advised to check pharmacy later on

## 2015-09-21 NOTE — Telephone Encounter (Signed)
Sent to pharmacy 

## 2015-09-21 NOTE — Telephone Encounter (Signed)
Please find out when she started the antibiotic and when she finished it. How much better if she? Percentage wise She have any other symptoms at this point such as fever? Chills? Sinus pressure or congestion question

## 2015-09-21 NOTE — Telephone Encounter (Signed)
Ok to refill Z-pak but if she is not 100% back to normal after finishing it she will need to be seen.

## 2015-09-21 NOTE — Telephone Encounter (Signed)
Left message for pt to call me back 

## 2015-10-04 ENCOUNTER — Ambulatory Visit
Admission: RE | Admit: 2015-10-04 | Discharge: 2015-10-04 | Disposition: A | Discharge status: Home / Self Care | Payer: BLUE CROSS/BLUE SHIELD | Source: Ambulatory Visit

## 2015-10-04 DIAGNOSIS — Z1231 Encounter for screening mammogram for malignant neoplasm of breast: Secondary | ICD-10-CM

## 2015-10-25 ENCOUNTER — Encounter: Payer: Self-pay | Admitting: Family Medicine

## 2015-10-25 ENCOUNTER — Ambulatory Visit (INDEPENDENT_AMBULATORY_CARE_PROVIDER_SITE_OTHER): Payer: BLUE CROSS/BLUE SHIELD | Admitting: Family Medicine

## 2015-10-25 VITALS — BP 120/86 | HR 64 | Resp 18 | Ht 68.0 in | Wt 159.4 lb

## 2015-10-25 DIAGNOSIS — R7303 Prediabetes: Secondary | ICD-10-CM | POA: Diagnosis not present

## 2015-10-25 HISTORY — DX: Prediabetes: R73.03

## 2015-10-25 LAB — POCT GLYCOSYLATED HEMOGLOBIN (HGB A1C): Hemoglobin A1C: 5.6

## 2015-10-25 NOTE — Progress Notes (Signed)
   Subjective:    Patient ID: Helen Walker, female    DOB: 05-28-1966, 50 y.o.   MRN: [id number]  HPI Chief Complaint  Patient presents with  . Follow-up    3 month follow up on A1c from 07/10/15 done by Dr. Jerilee Hoh (GYN). Sidenote-her dizziness has resolved.    She is here for follow up on hemoglobin A1c of 5.7 at her gynecologist office in January. She has been eating healthy, exercising and states she feels good.   Denies fever, chills, headache, dizziness, chest pain, shortness of breath, abdominal pain, GI or GU symptoms.      Review of Systems Pertinent positives and negatives in the history of present illness.     Objective:   Physical Exam BP 120/86 mmHg  Pulse 64  Resp 18  Ht 5\' 8"  (1.727 m)  Wt 159 lb 6.4 oz (72.303 kg)  BMI 24.24 kg/m2  LMP 11/16/2014 (Approximate)  Alert and oriented and in no acute distress. Not otherwise examined.  A1c 5.6    Assessment & Plan:  Prediabetes - Plan: HgB A1c  Congratulated her on making positive lifestyle modifications and lowering her A1c to 5.6 and out of the prediabetes range. Recommend continued last modifications and she will schedule for a complete physical exam and fasting labs later this fall. She will need to be referred for a screening colonoscopy at that time.

## 2015-12-13 DIAGNOSIS — G5682 Other specified mononeuropathies of left upper limb: Secondary | ICD-10-CM | POA: Diagnosis not present

## 2016-01-21 ENCOUNTER — Encounter: Payer: Self-pay | Admitting: Medical

## 2016-01-21 ENCOUNTER — Ambulatory Visit (INDEPENDENT_AMBULATORY_CARE_PROVIDER_SITE_OTHER): Payer: BLUE CROSS/BLUE SHIELD | Admitting: Medical

## 2016-01-21 VITALS — BP 108/80 | HR 76 | Temp 98.0°F | Wt 163.0 lb

## 2016-01-21 DIAGNOSIS — H6122 Impacted cerumen, left ear: Secondary | ICD-10-CM

## 2016-01-21 DIAGNOSIS — J029 Acute pharyngitis, unspecified: Secondary | ICD-10-CM

## 2016-01-21 DIAGNOSIS — H938X2 Other specified disorders of left ear: Secondary | ICD-10-CM

## 2016-01-21 DIAGNOSIS — J329 Chronic sinusitis, unspecified: Secondary | ICD-10-CM | POA: Diagnosis not present

## 2016-01-21 DIAGNOSIS — R0982 Postnasal drip: Secondary | ICD-10-CM

## 2016-01-21 NOTE — Progress Notes (Signed)
Subjective:     Patient ID: Helen Walker, female   DOB: [birth year], 50 y.o.   MRN: [id number]  HPI Chief Complaint  Patient presents with  . Sinusitis    sore throat. started saturday. congested. has a photo of a yellow reddish lump that came out of her throat.    here for possible sinus infection.  Normally sees Vickie NP here.   She notes few days hx/o having sore throat, left ear pressure, some post nasal drainage.  No cough, no fever, no NVD, has some lower teeth discomfort, some headache.   No sick contacts.  No distinct sinus pressure.   Has had sinus infection prior but not frequent sinus infection.  Using nothing for symptoms.  Coughed up some mucous today.  No other aggravating or relieving factors. No other complaint.  Review of Systems  Past Medical History:  Diagnosis Date  . Bladder mass 2013  . Medical history non-contributory         Objective:   Physical Exam BP 108/80   Pulse 76   Temp 98 F (36.7 C) (Tympanic)   Wt 163 lb (73.9 kg)   LMP 11/13/2014   BMI 24.78 kg/m   General appearance: alert, no distress, WD/WN HEENT: normocephalic, sclerae anicteric, bilat ear canals with impacted cerumen, nares patent, dry mucoid discharge, mild erythema, pharynx with some mild post nasal drainage Oral cavity: MMM, no lesions Neck: supple, shoddy anterior nodes that are tender, otherwise no thyromegaly, no masses Lungs: CTA bilaterally, no wheezes, rhonchi, or rales       Assessment:     Encounter Diagnoses  Name Primary?  . Ear pressure, left Yes  . Impacted cerumen of left ear   . Sore throat   . Post-nasal drainage        Plan:      Discussed findings.  Discussed risk/benefits of procedure and patient agrees to procedure. Successfully used warm water lavage to remove impacted cerumen from bilat ear canals. Patient tolerated procedure well. Advised they avoid using any cotton swabs or other devices to clean the ear canals.  Use basic hygiene as  discussed.  Follow up prn.   Sore throat, pnd - begin mucinex or benadryl OTC the next several days.   If worse or not improving, call or return.

## 2016-01-23 ENCOUNTER — Telehealth: Payer: Self-pay | Admitting: Family Medicine

## 2016-01-23 ENCOUNTER — Other Ambulatory Visit: Payer: Self-pay | Admitting: Medical

## 2016-01-23 MED ORDER — AMOXICILLIN 875 MG PO TABS: 875.0000 mg | ORAL_TABLET | Freq: Two times a day (BID) | ORAL | 0 refills | Status: DC

## 2016-01-23 NOTE — Telephone Encounter (Signed)
Pt is aware.  

## 2016-01-23 NOTE — Telephone Encounter (Signed)
Pt left voice mail that she is no better and you advised her to call and let you know.  She would like rx to [facility] on [address]   Pt [phone]

## 2016-01-23 NOTE — Telephone Encounter (Signed)
I sent amoxicillin for possible sinus infection since her symptom are worse

## 2016-03-18 ENCOUNTER — Encounter: Payer: Self-pay | Admitting: Family Medicine

## 2016-03-18 ENCOUNTER — Ambulatory Visit (INDEPENDENT_AMBULATORY_CARE_PROVIDER_SITE_OTHER): Payer: BLUE CROSS/BLUE SHIELD | Admitting: Family Medicine

## 2016-03-18 VITALS — BP 118/72 | HR 64 | Temp 98.1°F | Ht 68.0 in | Wt 159.4 lb

## 2016-03-18 DIAGNOSIS — J069 Acute upper respiratory infection, unspecified: Secondary | ICD-10-CM

## 2016-03-18 NOTE — Progress Notes (Signed)
Chief Complaint  Patient presents with  . Cough    and ear pain B/L since Friday. [place] While she was there the air quality was poor and she is having a hard time breathing. When she breaths in she has pain in her right shoulder and back.    Flew back from [place] on 9/14. The following day she developed pain by the left shoulder blade with deep breaths as well as headache (at her left temple), felt fatigued/jet-lagged.  That pain (and headache) only lasted for 1 day, resolved.   This morning, when she woke up she had bilateral ear pain and cough, as well as sore throat.  No runny nose, sinus pain/pressure.  She had some shortness of breath going up the stairs at work this morning.   Cough is productive of yellow phlegm.  PMH, PSH, SH reviewed.  Outpatient Encounter Prescriptions as of 03/18/2016  Medication Sig Note  . valACYclovir (VALTREX) 500 MG tablet Take 1 tablet (500 mg total) by mouth 2 (two) times daily. (Patient not taking: Reported on 03/18/2016) 03/18/2016: Uses prn herpes outbreak  . [DISCONTINUED] amoxicillin (AMOXIL) 875 MG tablet Take 1 tablet (875 mg total) by mouth 2 (two) times daily.    No facility-administered encounter medications on file as of 03/18/2016.    No Known Allergies  ROS: No known fever, chills. No ongoing headaches (one at left temple only lasted 1 day). She has had some dizziness/vertigo/dysequilibrium since returning home--mild, no nausea/vomiting.  No diarrhea, urinary complaints, bleeding, bruising, rashes.  PHYSICAL EXAM: BP 118/72 (BP Location: Left Arm, Patient Position: Sitting, Cuff Size: Normal)   Pulse 64   Temp 98.1 F (36.7 C) (Tympanic)   Ht 5\' 8"  (1.727 m)   Wt 159 lb 6.4 oz (72.3 kg)   LMP 11/13/2014   BMI 24.24 kg/m   Well-appearing, pleasant female in no distress. No coughing during visit. HEENT: PERRL, EOMi, conjunctiva and sclera are clear. TM's and EAC's normal--slightly retracted on the  left. Right was partially occluded by cerumen, but visualized portion was normal. Nasal mucosa was mild-mod edematous, no purulent drainage noted,  Slight yellow crusting distally Sinuses nontender OP clear Neck: no lymphadenopathy or mass Back: no spinal or CVA tenderness.  nontender at left scapula, no muscle tenderness; no longer having any pain with deep breaths Lungs: clear bilaterally--no wheezes, rales, ronchi Heart: regullar rate and rhythm wtihout murmur Extremities: no edema Skin: normal turgor, no rash Neuro: alert and oriented, cranial nerves intact,  Normal strength, gait  ASSESSMENT/PLAN:  Acute upper respiratory infection  Supportive measures reviewed.    I suspect that your ear discomfort is related to some nasal congestion, eustachian tube dysfunction.  I suspect there is some postnasal drainage that is contributing to the sore throat and the cough.  Your lungs were completely clear--no evidence of pneumonia, bronchitis, wheezing.    You can use decongestants such as sudafed--this should help with the ear discomfort, as well as dry up any drainage. I also recommend use of Mucinex (guaifenesin--expectorant) to help loosen up any thick phlegm. If cough is bad, you can use the DM version of mucinex/robitussin vs getting a separate Delsym syrup (which is dextromethorphan--cough suppressant--separately in a 12 hour syrup).    Return if you develop fever, worsening shortness of breath, persistent (lasting >7 days, worsening/changing) discolored mucus or phlegm, and blood in the phlegm.    Gets flu shot at work in October

## 2016-03-18 NOTE — Patient Instructions (Signed)
  I suspect that your ear discomfort is related to some nasal congestion, eustachian tube dysfunction.  I suspect there is some postnasal drainage that is contributing to the sore throat and the cough.  Your lungs were completely clear--no evidence of pneumonia, bronchitis, wheezing.    You can use decongestants such as sudafed--this should help with the ear discomfort, as well as dry up any drainage. I also recommend use of Mucinex (guaifenesin--expectorant) to help loosen up any thick phlegm. If cough is bad, you can use the DM version of mucinex/robitussin vs getting a separate Delsym syrup (which is dextromethorphan--cough suppressant--separately in a 12 hour syrup).    Return if you develop fever, worsening shortness of breath, persistent (lasting >7 days, worsening/changing) discolored mucus or phlegm, and blood in the phlegm.

## 2016-04-15 DIAGNOSIS — Z23 Encounter for immunization: Secondary | ICD-10-CM | POA: Diagnosis not present

## 2016-07-23 ENCOUNTER — Ambulatory Visit (INDEPENDENT_AMBULATORY_CARE_PROVIDER_SITE_OTHER): Payer: BLUE CROSS/BLUE SHIELD | Admitting: Family Medicine

## 2016-07-23 ENCOUNTER — Encounter: Payer: Self-pay | Admitting: Family Medicine

## 2016-07-23 VITALS — BP 120/60 | HR 77 | Temp 98.0°F | Wt 154.6 lb

## 2016-07-23 DIAGNOSIS — N3000 Acute cystitis without hematuria: Secondary | ICD-10-CM

## 2016-07-23 DIAGNOSIS — R300 Dysuria: Secondary | ICD-10-CM

## 2016-07-23 LAB — POCT URINALYSIS DIPSTICK
Bilirubin, UA: NEGATIVE
Blood, UA: NEGATIVE
Glucose, UA: NEGATIVE
Ketones, UA: NEGATIVE
Leukocytes, UA: NEGATIVE
Nitrite, UA: NEGATIVE
Protein, UA: NEGATIVE
Spec Grav, UA: 1.015
Urobilinogen, UA: NEGATIVE
pH, UA: 6

## 2016-07-23 MED ORDER — NITROFURANTOIN MONOHYD MACRO 100 MG PO CAPS: 100.0000 mg | ORAL_CAPSULE | Freq: Two times a day (BID) | ORAL | 0 refills | Status: DC

## 2016-07-23 NOTE — Progress Notes (Signed)
Subjective:  Helen Walker is a 51 y.o. female who complains of possible urinary tract infection.  She has had symptoms for 4 days.  Symptoms include suprapubic pressure, low back pain, darker urine and burning with urination. Patient denies fever, chils, nausea, vomiting, vaginal discharge.  Last UTI was at least one year.   Using noting for current symptoms.    Patient does not have a history of recurrent UTI. Patient does not have a history of pyelonephritis.  No other aggravating or relieving factors.  No other c/o.  Past Medical History:  Diagnosis Date  . Bladder mass 2013  . Medical history non-contributory     ROS as in subjective  Reviewed allergies, medications, past medical, surgical, and social history.    Objective: Vitals:   07/23/16 1616  BP: 120/60  Pulse: 77  Temp: 98 F (36.7 C)    General appearance: alert, no distress, WD/WN, female Abdomen: +bs, soft, non tender, non distended, no masses, no hepatomegaly, no splenomegaly, no bruits Back: no CVA tenderness GU: declined     Laboratory:  Urine dipstick: negative for all components.       Assessment:   Plan: Discussed symptoms, diagnosis, possible complications, and usual course of illness.  Begin medication Macrobid.  Advised increased water intake, can use OTC Tylenol for pain.       Urine culture not sent.    Call or return if worse or not improving.

## 2016-07-25 ENCOUNTER — Telehealth: Payer: Self-pay | Admitting: Family Medicine

## 2016-07-25 MED ORDER — FLUCONAZOLE 150 MG PO TABS: 150.0000 mg | ORAL_TABLET | Freq: Once | ORAL | 0 refills | Status: AC

## 2016-07-25 NOTE — Telephone Encounter (Signed)
Please take care of this.  

## 2016-07-25 NOTE — Telephone Encounter (Signed)
Pt called and stated that she is still having issues. She is requesting that Diflucan be sent in for her. [facility] at [facility] on [address]. Pt can be reached at [phone].

## 2016-07-25 NOTE — Telephone Encounter (Signed)
done

## 2016-09-22 ENCOUNTER — Telehealth: Payer: Self-pay | Admitting: Family Medicine

## 2016-09-22 ENCOUNTER — Other Ambulatory Visit: Payer: Self-pay | Admitting: Gynecology

## 2016-09-22 DIAGNOSIS — Z1231 Encounter for screening mammogram for malignant neoplasm of breast: Secondary | ICD-10-CM

## 2016-09-22 MED ORDER — VALACYCLOVIR HCL 500 MG PO TABS: 500.0000 mg | ORAL_TABLET | Freq: Two times a day (BID) | ORAL | 1 refills | Status: DC

## 2016-09-22 NOTE — Telephone Encounter (Signed)
Pt called and left message on voice mail she sees she had an STD in her file and was never treated.  Please call in her Valtrex to [facility] on [address].

## 2016-09-22 NOTE — Telephone Encounter (Signed)
Pt was aware she had STD but just needed a refill on it. I have refilled med

## 2016-09-22 NOTE — Telephone Encounter (Signed)
Please call and find out what she is talking about specifically. Ok to refill her Valtrex.

## 2016-09-23 ENCOUNTER — Other Ambulatory Visit: Payer: Self-pay | Admitting: Family Medicine

## 2016-09-23 MED ORDER — VALACYCLOVIR HCL 500 MG PO TABS: 500.0000 mg | ORAL_TABLET | Freq: Two times a day (BID) | ORAL | 1 refills | Status: DC

## 2016-10-08 ENCOUNTER — Inpatient Hospital Stay: Admission: RE | Admit: 2016-10-08 | Payer: BLUE CROSS/BLUE SHIELD | Source: Ambulatory Visit

## 2016-10-28 ENCOUNTER — Encounter: Payer: Self-pay | Admitting: Family Medicine

## 2016-10-28 ENCOUNTER — Ambulatory Visit (INDEPENDENT_AMBULATORY_CARE_PROVIDER_SITE_OTHER): Payer: BLUE CROSS/BLUE SHIELD | Admitting: Family Medicine

## 2016-10-28 VITALS — BP 104/64 | HR 76 | Temp 97.8°F | Wt 158.0 lb

## 2016-10-28 DIAGNOSIS — R102 Pelvic and perineal pain: Secondary | ICD-10-CM | POA: Diagnosis not present

## 2016-10-28 DIAGNOSIS — B373 Candidiasis of vulva and vagina: Secondary | ICD-10-CM | POA: Diagnosis not present

## 2016-10-28 DIAGNOSIS — B9689 Other specified bacterial agents as the cause of diseases classified elsewhere: Secondary | ICD-10-CM

## 2016-10-28 DIAGNOSIS — N898 Other specified noninflammatory disorders of vagina: Secondary | ICD-10-CM

## 2016-10-28 DIAGNOSIS — B3731 Acute candidiasis of vulva and vagina: Secondary | ICD-10-CM

## 2016-10-28 DIAGNOSIS — N760 Acute vaginitis: Secondary | ICD-10-CM

## 2016-10-28 LAB — POCT WET PREP (WET MOUNT)
Clue Cells Wet Prep Whiff POC: POSITIVE
KOH Wet Prep POC: POSITIVE
Trichomonas Wet Prep HPF POC: ABSENT

## 2016-10-28 LAB — POCT URINALYSIS DIPSTICK
Bilirubin, UA: NEGATIVE
Blood, UA: NEGATIVE
Ketones, UA: NEGATIVE
Leukocytes, UA: NEGATIVE
Nitrite, UA: NEGATIVE
Protein, UA: NEGATIVE
Spec Grav, UA: 1.02 (ref 1.010–1.025)
Urobilinogen, UA: 0.2 E.U./dL
pH, UA: 6 (ref 5.0–8.0)

## 2016-10-28 MED ORDER — METRONIDAZOLE 500 MG PO TABS: 500.0000 mg | ORAL_TABLET | Freq: Two times a day (BID) | ORAL | 0 refills | Status: DC

## 2016-10-28 MED ORDER — FLUCONAZOLE 150 MG PO TABS: 150.0000 mg | ORAL_TABLET | Freq: Once | ORAL | 0 refills | Status: AC

## 2016-10-28 NOTE — Progress Notes (Signed)
   Subjective:    Patient ID: Helen Walker, female    DOB: 11-Apr-1966, 51 y.o.   MRN: [id number]  HPI Chief Complaint  Patient presents with  . vaginal odor    vaginal odor started sunday- like sardines. no discharge, pain only when pressing on it   She is here with complaints of vaginal odor since having a sexual encounter 3 days ago. Also reports having suprapubic pressure. No urinary frequency or dysuria. She has noticed some urgency over the past 2-3 days and has had some urinary leakage when unable to get to the restroom in time.  Denies fever, chills, back pain, nausea, vomiting, diarrhea.   Reports having a hysterectomy in 2016 and states she thinks she has one ovary left.  No new sexual partners. Same female partner for past 6 years.   Reviewed allergies, medications, past medical, surgical,  and social history.    Review of Systems Pertinent positives and negatives in the history of present illness.     Objective:   Physical Exam  Constitutional: She appears well-developed and well-nourished. No distress.  Pulmonary/Chest: Effort normal.  Abdominal: Soft. Bowel sounds are normal. She exhibits no distension. There is no tenderness. There is no rigidity, no rebound, no guarding, no CVA tenderness, no tenderness at McBurney's point and negative Murphy's sign. Hernia confirmed negative in the right inguinal area and confirmed negative in the left inguinal area.  Genitourinary: There is no rash, tenderness or lesion on the right labia. There is no rash, tenderness or lesion on the left labia. Right adnexum displays no mass, no tenderness and no fullness. Left adnexum displays no mass, no tenderness and no fullness. No tenderness in the vagina. Vaginal discharge found.  Genitourinary Comments: Discharge white, clumpy  Lymphadenopathy:       Right: No inguinal adenopathy present.       Left: No inguinal adenopathy present.   BP 104/64   Pulse 76   Temp 97.8 F (36.6 C) (Oral)    Wt 158 lb (71.7 kg)   LMP 11/16/2014 (Approximate)   SpO2 99%   BMI 24.02 kg/m       Assessment & Plan:  Vaginal odor - Plan: POCT Wet Prep [facility]), GC/Chlamydia Probe Amp  Suprapubic pressure - Plan: POCT Wet Prep [facility]), POCT urinalysis dipstick, GC/Chlamydia Probe Amp  BV (bacterial vaginosis) - Plan: metroNIDAZOLE (FLAGYL) 500 MG tablet  Candidiasis of vagina - Plan: fluconazole (DIFLUCAN) 150 MG tablet  UA negative.  Wet mount +clue cells, +whiff, +yeast, -trich GC/CT ordered.  Counseled on management of BV and yeast infections.  Advised to avoid alcohol with Metronidazole.  She will follow up if not back to baseline after completing the antibiotic.  Discussed doing Kegels and let me know if urine leakage does not improve after treatment.

## 2016-10-28 NOTE — Patient Instructions (Signed)
Do not drink alcohol this this medication.   Metronidazole tablets or capsules What is this medicine? METRONIDAZOLE (me troe NI da zole) is an antiinfective. It is used to treat certain kinds of bacterial and protozoal infections. It will not work for colds, flu, or other viral infections. This medicine may be used for other purposes; ask your health care provider or pharmacist if you have questions. COMMON BRAND NAME(S): Flagyl What should I tell my health care provider before I take this medicine? They need to know if you have any of these conditions: -anemia or other blood disorders -disease of the nervous system -fungal or yeast infection -if you drink alcohol containing drinks -liver disease -seizures -an unusual or allergic reaction to metronidazole, or other medicines, foods, dyes, or preservatives -pregnant or trying to get pregnant -breast-feeding How should I use this medicine? Take this medicine by mouth with a full glass of water. Follow the directions on the prescription label. Take your medicine at regular intervals. Do not take your medicine more often than directed. Take all of your medicine as directed even if you think you are better. Do not skip doses or stop your medicine early. Talk to your pediatrician regarding the use of this medicine in children. Special care may be needed. Overdosage: If you think you have taken too much of this medicine contact a poison control center or emergency room at once. NOTE: This medicine is only for you. Do not share this medicine with others. What if I miss a dose? If you miss a dose, take it as soon as you can. If it is almost time for your next dose, take only that dose. Do not take double or extra doses. What may interact with this medicine? Do not take this medicine with any of the following medications: -alcohol or any product that contains alcohol -amprenavir oral  solution -cisapride -disulfiram -dofetilide -dronedarone -paclitaxel injection -pimozide -ritonavir oral solution -sertraline oral solution -sulfamethoxazole-trimethoprim injection -thioridazine -ziprasidone This medicine may also interact with the following medications: -birth control pills -cimetidine -lithium -other medicines that prolong the QT interval (cause an abnormal heart rhythm) -phenobarbital -phenytoin -warfarin This list may not describe all possible interactions. Give your health care provider a list of all the medicines, herbs, non-prescription drugs, or dietary supplements you use. Also tell them if you smoke, drink alcohol, or use illegal drugs. Some items may interact with your medicine. What should I watch for while using this medicine? Tell your doctor or health care professional if your symptoms do not improve or if they get worse. You may get drowsy or dizzy. Do not drive, use machinery, or do anything that needs mental alertness until you know how this medicine affects you. Do not stand or sit up quickly, especially if you are an older patient. This reduces the risk of dizzy or fainting spells. Avoid alcoholic drinks while you are taking this medicine and for three days afterward. Alcohol may make you feel dizzy, sick, or flushed. If you are being treated for a sexually transmitted disease, avoid sexual contact until you have finished your treatment. Your sexual partner may also need treatment. What side effects may I notice from receiving this medicine? Side effects that you should report to your doctor or health care professional as soon as possible: -allergic reactions like skin rash or hives, swelling of the face, lips, or tongue -confusion, clumsiness -difficulty speaking -discolored or sore mouth -dizziness -fever, infection -numbness, tingling, pain or weakness in the hands or feet -  trouble passing urine or change in the amount of urine -redness,  blistering, peeling or loosening of the skin, including inside the mouth -seizures -unusually weak or tired -vaginal irritation, dryness, or discharge Side effects that usually do not require medical attention (report to your doctor or health care professional if they continue or are bothersome): -diarrhea -headache -irritability -metallic taste -nausea -stomach pain or cramps -trouble sleeping This list may not describe all possible side effects. Call your doctor for medical advice about side effects. You may report side effects to FDA at 1-800-FDA-1088. Where should I keep my medicine? Keep out of the reach of children. Store at room temperature below 25 degrees C (77 degrees F). Protect from light. Keep container tightly closed. Throw away any unused medicine after the expiration date. NOTE: This sheet is a summary. It may not cover all possible information. If you have questions about this medicine, talk to your doctor, pharmacist, or health care provider.  2018 Elsevier/Gold Standard (2013-01-21 14:08:39)

## 2016-10-29 LAB — GC/CHLAMYDIA PROBE AMP
CT Probe RNA: NOT DETECTED
GC Probe RNA: NOT DETECTED

## 2016-11-12 ENCOUNTER — Encounter: Payer: Self-pay | Admitting: Gynecology

## 2017-01-12 ENCOUNTER — Ambulatory Visit
Admission: RE | Admit: 2017-01-12 | Discharge: 2017-01-12 | Disposition: A | Discharge status: Home / Self Care | Payer: BLUE CROSS/BLUE SHIELD | Source: Ambulatory Visit | Attending: Gynecology | Admitting: Gynecology

## 2017-01-12 DIAGNOSIS — Z1231 Encounter for screening mammogram for malignant neoplasm of breast: Secondary | ICD-10-CM | POA: Diagnosis not present

## 2017-01-23 ENCOUNTER — Ambulatory Visit (INDEPENDENT_AMBULATORY_CARE_PROVIDER_SITE_OTHER): Payer: BLUE CROSS/BLUE SHIELD | Admitting: Medical

## 2017-01-23 VITALS — BP 112/76 | HR 64 | Resp 18 | Wt 158.8 lb

## 2017-01-23 DIAGNOSIS — R350 Frequency of micturition: Secondary | ICD-10-CM | POA: Diagnosis not present

## 2017-01-23 DIAGNOSIS — R300 Dysuria: Secondary | ICD-10-CM | POA: Diagnosis not present

## 2017-01-23 DIAGNOSIS — A6000 Herpesviral infection of urogenital system, unspecified: Secondary | ICD-10-CM | POA: Diagnosis not present

## 2017-01-23 LAB — POCT URINALYSIS DIP (PROADVANTAGE DEVICE)
Bilirubin, UA: NEGATIVE
Blood, UA: NEGATIVE
Glucose, UA: NEGATIVE mg/dL
Ketones, POC UA: NEGATIVE mg/dL
Leukocytes, UA: NEGATIVE
Nitrite, UA: NEGATIVE
Protein Ur, POC: NEGATIVE mg/dL
Specific Gravity, Urine: 1.025
Urobilinogen, Ur: NEGATIVE
pH, UA: 6 (ref 5.0–8.0)

## 2017-01-23 MED ORDER — NITROFURANTOIN MONOHYD MACRO 100 MG PO CAPS: 100.0000 mg | ORAL_CAPSULE | Freq: Two times a day (BID) | ORAL | 0 refills | Status: DC

## 2017-01-23 MED ORDER — VALACYCLOVIR HCL 500 MG PO TABS: 500.0000 mg | ORAL_TABLET | Freq: Two times a day (BID) | ORAL | 1 refills | Status: DC

## 2017-01-23 NOTE — Progress Notes (Signed)
Subjective: Chief Complaint  Patient presents with  . Urinary Tract Infection    abd pressure and frequent urination.    Having urinary pressure for a week.   No vaginal discharge.   No burning with urination.  Does have urinary urgency.   No urinary odor, no blood in urine, no fever, no NVD.  Has a little right side back pain, lower back.  No concern for STD.  Has had some UTIs, but not frequently.  Using nothing for symptoms.   Does have hx/o genital herpes, uses valtrex prn, no recent outbreak.  No other aggravating or relieving factors.  She is s/p hysterectomy 2016.  Denies vaginal dryness, irritation or hx/o atrophy.   No other menopausal symptoms No other complaint.  Past Medical History:  Diagnosis Date  . Bladder mass 2013  . Medical history non-contributory    No current outpatient prescriptions on file prior to visit.   No current facility-administered medications on file prior to visit.    ROS as in subjective   Objective: BP 112/76   Pulse 64   Resp 18   Wt 158 lb 12.8 oz (72 kg)   LMP 11/16/2014 (Approximate)   SpO2 96%   BMI 24.15 kg/m   Gen: wd, wn, nad No cva tenderness Abdomen: +bs, soft, nontender, no mass, no organomegaly    Assessment; Encounter Diagnoses  Name Primary?  . Urinary frequency Yes  . Dysuria   . Genital herpes simplex, unspecified site      Plan Discussed symptoms and UA which was unremarkable.  We will send urine for culture, begin Macrobid empirically, hydrate well.  discussed other possible differential including atrophic vaginitis  refilled valtrex for prn use for genital herpes.     Call/return if worse or not improving in the next 4-5 days.  Helen Walker was seen today for urinary tract infection.  Diagnoses and all orders for this visit:  Urinary frequency -     POCT Urinalysis DIP (Proadvantage Device) -     Urine Culture  Dysuria -     Urine Culture  Genital herpes simplex, unspecified site  Other orders -      nitrofurantoin, macrocrystal-monohydrate, (MACROBID) 100 MG capsule; Take 1 capsule (100 mg total) by mouth 2 (two) times daily. -     valACYclovir (VALTREX) 500 MG tablet; Take 1 tablet (500 mg total) by mouth 2 (two) times daily. X 3 days for flare up

## 2017-01-23 NOTE — Patient Instructions (Signed)
Recommendations:  We will begin Macrobid antibiotic  hydrate well with water  Consider using some cranberry juice the next days  We will call with urine culture results   Atrophic Vaginitis Atrophic vaginitis is when the tissues that line the vagina become dry and thin. This is caused by a drop in estrogen. Estrogen helps:  To keep the vagina moist.  To make a clear fluid that helps: ? To lubricate the vagina for sex. ? To protect the vagina from infection.  If the lining of the vagina is dry and thin, it may:  Make sex painful. It may also cause bleeding.  Cause a feeling of: ? Burning. ? Irritation. ? Itchiness.  Make an exam of your vagina painful. It may also cause bleeding.  Make you lose interest in sex.  Cause a burning feeling when you pee.  Make your vaginal fluid (discharge) brown or yellow.  For some women, there are no symptoms. This condition is most common in women who do not get their regular menstrual periods anymore (menopause). This often starts when a woman is 47-60 years old. Follow these instructions at home:  Take medicines only as told by your doctor. Do not use any herbal or alternative medicines unless your doctor says it is okay.  Use over-the-counter products for dryness only as told by your doctor. These include: ? Creams. ? Lubricants. ? Moisturizers.  Do not douche.  Do not use products that can make your vagina dry. These include: ? Scented feminine sprays. ? Scented tampons. ? Scented soaps.  If it hurts to have sex, tell your sexual partner. Contact a doctor if:  Your discharge looks different than normal.  Your vagina has an unusual smell.  You have new symptoms.  Your symptoms do not get better with treatment.  Your symptoms get worse. This information is not intended to replace advice given to you by your health care provider. Make sure you discuss any questions you have with your health care provider. Document  Released: 12/03/2007 Document Revised: 11/22/2015 Document Reviewed: 06/07/2014 Elsevier Interactive Patient Education  [employer].

## 2017-01-24 LAB — URINE CULTURE

## 2017-03-24 ENCOUNTER — Ambulatory Visit (INDEPENDENT_AMBULATORY_CARE_PROVIDER_SITE_OTHER): Payer: BLUE CROSS/BLUE SHIELD | Admitting: Family Medicine

## 2017-03-24 ENCOUNTER — Encounter: Payer: Self-pay | Admitting: Family Medicine

## 2017-03-24 VITALS — BP 106/62 | HR 72 | Temp 98.0°F | Wt 157.6 lb

## 2017-03-24 DIAGNOSIS — M545 Low back pain: Secondary | ICD-10-CM

## 2017-03-24 DIAGNOSIS — Z23 Encounter for immunization: Secondary | ICD-10-CM

## 2017-03-24 DIAGNOSIS — R8290 Unspecified abnormal findings in urine: Secondary | ICD-10-CM

## 2017-03-24 LAB — POCT URINALYSIS DIP (PROADVANTAGE DEVICE)
Bilirubin, UA: NEGATIVE
Blood, UA: NEGATIVE
Glucose, UA: NEGATIVE mg/dL
Ketones, POC UA: NEGATIVE mg/dL
Leukocytes, UA: NEGATIVE
Nitrite, UA: NEGATIVE
Protein Ur, POC: NEGATIVE mg/dL
Specific Gravity, Urine: NEGATIVE
Urobilinogen, Ur: NEGATIVE
pH, UA: 6 (ref 5.0–8.0)

## 2017-03-24 NOTE — Progress Notes (Signed)
   Subjective:    Patient ID: Helen Walker, female    DOB: [birth year], 51 y.o.   MRN: [id number]  HPI 5 days ago she noted an odor to her urine as well as some low back pain but no frequency, urgency or dysuria No fever or chills.  Review of Systems     Objective:   Physical Exam Alert and in no distress. Scoliosis is noted of her back. No palpable tenderness with normal motion.of her back. Normal hip motion. Negative straight leg raising. Urine dipstick was negative.      Assessment & Plan:  Abnormal urine - Plan: POCT Urinalysis DIP (Proadvantage Device)  Need for influenza vaccination - Plan: Flu Vaccine QUAD 36+ mos IM  Low back pain, unspecified back pain laterality, unspecified chronicity, with sciatica presence unspecified  I explained the abnormal urine was probably as much the food that she ate. Explained proper care of her back in terms of posture etc. Etc. Recommend supportive care. Flu shot also given.

## 2017-07-06 ENCOUNTER — Ambulatory Visit (INDEPENDENT_AMBULATORY_CARE_PROVIDER_SITE_OTHER): Payer: BLUE CROSS/BLUE SHIELD | Admitting: Family Medicine

## 2017-07-06 ENCOUNTER — Encounter: Payer: Self-pay | Admitting: Family Medicine

## 2017-07-06 ENCOUNTER — Ambulatory Visit
Admission: RE | Admit: 2017-07-06 | Discharge: 2017-07-06 | Disposition: A | Discharge status: Home / Self Care | Payer: BLUE CROSS/BLUE SHIELD | Source: Ambulatory Visit | Attending: Family Medicine | Admitting: Family Medicine

## 2017-07-06 VITALS — BP 112/70 | HR 74 | Resp 16 | Wt 155.0 lb

## 2017-07-06 DIAGNOSIS — M545 Low back pain, unspecified: Secondary | ICD-10-CM

## 2017-07-06 DIAGNOSIS — M419 Scoliosis, unspecified: Secondary | ICD-10-CM | POA: Diagnosis not present

## 2017-07-06 LAB — POCT URINALYSIS DIP (PROADVANTAGE DEVICE)
Bilirubin, UA: NEGATIVE
Blood, UA: NEGATIVE
Glucose, UA: NEGATIVE mg/dL
Ketones, POC UA: NEGATIVE mg/dL
Leukocytes, UA: NEGATIVE
Nitrite, UA: NEGATIVE
Protein Ur, POC: NEGATIVE mg/dL
Specific Gravity, Urine: 1.015
Urobilinogen, Ur: NEGATIVE
pH, UA: 7 (ref 5.0–8.0)

## 2017-07-06 NOTE — Progress Notes (Signed)
   Subjective:    Patient ID: Helen Walker, female    DOB: 04-01-1966, 52 y.o.   MRN: [id number]  HPI She states that she has a 2-week history of low back pain.  It does tend to bother her in the morning but can also occur during the day.  It was difficult for her to give me of the definitive answer.  Motion in general does make this worse.  She does complain of radiation down her left leg but this comes and goes.  No weakness, numbness or tingling.  No urinary or GI symptoms.  She then mentioned that she has a previous history of scoliosis and her husband states that he has noted that she intermittently does complain of back pain.   Review of Systems     Objective:   Physical Exam Alert and in no distress.  Tender to palpation over the lower lumbar spine and paravertebral muscles.  She does complain of lateral, forward flexion and rotation pain.  Normal hip motion.  DTRs normal.  Negative straight leg raising. X-ray does show some scoliosis.       Assessment & Plan:  Midline low back pain without sciatica, unspecified chronicity - Plan: DG Lumbar Spine Complete, POCT Urinalysis DIP (Proadvantage Device)  Scoliosis of thoracolumbar spine, unspecified scoliosis type I would treat this conservatively with heat, 2 Aleve twice per day and stretching exercises.  If continued difficulty she should return here for further evaluation and possible referral to physical therapy

## 2017-07-06 NOTE — Patient Instructions (Signed)
Continue with Tylenol 2 pills 4 times a day as needed but I also want you to take 2 Aleve twice per day regularly.  Heat for 20 minutes 3 times per day roughly working around her schedule and gentle stretching afterwards

## 2017-07-17 ENCOUNTER — Encounter: Payer: Self-pay | Admitting: Medical

## 2017-07-17 ENCOUNTER — Ambulatory Visit (INDEPENDENT_AMBULATORY_CARE_PROVIDER_SITE_OTHER): Payer: BLUE CROSS/BLUE SHIELD | Admitting: Medical

## 2017-07-17 VITALS — BP 114/70 | HR 96 | Temp 98.3°F | Wt 160.4 lb

## 2017-07-17 DIAGNOSIS — J0110 Acute frontal sinusitis, unspecified: Secondary | ICD-10-CM | POA: Diagnosis not present

## 2017-07-17 DIAGNOSIS — R059 Cough, unspecified: Secondary | ICD-10-CM

## 2017-07-17 DIAGNOSIS — R05 Cough: Secondary | ICD-10-CM | POA: Diagnosis not present

## 2017-07-17 MED ORDER — HYDROCODONE-HOMATROPINE 5-1.5 MG/5ML PO SYRP: 5.0000 mL | ORAL_SOLUTION | Freq: Three times a day (TID) | ORAL | 0 refills | Status: DC | PRN

## 2017-07-17 MED ORDER — AZITHROMYCIN 250 MG PO TABS: ORAL_TABLET | ORAL | 0 refills | Status: DC

## 2017-07-17 NOTE — Progress Notes (Signed)
Subjective:  Helen Walker is a 52 y.o. female who presents for illness.   She reports several week history of cough, sinus pressure, chest and head congestion, some body aches, low-grade fever earlier in the week, cold feeling.  Over a week ago she was in [place] in hot weather than the early part of this week was really cold at work since the heat was not working.  She denies nausea vomiting or diarrhea no ear pain no sore throat.  She does have sick contacts in her household.  Using Mucinex without any relief.  Still getting up thick green mucus  Doesn't get sick very often.  No other aggravating or relieving factors.  No other c/o.  ROS as in subjective   Objective: BP 114/70   Pulse 96   Temp 98.3 F (36.8 C)   Wt 160 lb 6.4 oz (72.8 kg)   LMP 11/16/2014 (Approximate)   SpO2 99%   BMI 24.39 kg/m   General appearance: Alert, WD/WN, no distress                             Skin: warm, no rash                           Head: + frontal sinus tenderness,                            Eyes: conjunctiva normal, corneas clear, PERRLA                            Ears: flat TMs, external ear canals normal                          Nose: septum midline, turbinates swollen, with erythema and mucoid discharge             Mouth/throat: MMM, tongue normal, mild pharyngeal erythema                           Neck: supple, no adenopathy, no thyromegaly, non tender                          Heart: RRR, normal S1, S2, no murmurs                         Lungs: CTA bilaterally, no wheezes, rales, or rhonchi        Assessment  Encounter Diagnoses  Name Primary?  . Acute non-recurrent frontal sinusitis Yes  . Cough       Plan: Discussed symptoms and exam findings, treatment recommendations.  Begin Z-Pak antibiotic, can use Hycodan as needed nightly for worse cough, discussed other recommendations below.  Specific home care recommendations today include:  Decongestant: You may use OTC  Guaifenesin (Mucinex plain) for congestion.  You may use Pseudoephedrine (Sudafed) only if you don't have blood pressure problems or a diagnosis of hypertension.  Pain/fever relief: You may use over-the-counter Tylenol for pain or fever  Drink extra fluids. Fluids help thin the mucus so your sinuses can drain more easily.   Applying either moist heat or ice packs to the sinus areas may help relieve discomfort.  Use saline nasal sprays to  help moisten your sinuses. The sprays can be found at your local drugstore.    Helen Walker was seen today for coughing and congestion.  Diagnoses and all orders for this visit:  Acute non-recurrent frontal sinusitis  Cough  Other orders -     azithromycin (ZITHROMAX) 250 MG tablet; 2 tablets day 1, then 1 tablet days 2-4 -     HYDROcodone-homatropine (HYCODAN) 5-1.5 MG/5ML syrup; Take 5 mLs by mouth every 8 (eight) hours as needed for cough.   Patient was advised to call or return if worse or not improving in the next few days.    Patient voiced understanding of diagnosis, recommendations, and treatment plan.

## 2017-10-20 ENCOUNTER — Other Ambulatory Visit: Payer: Self-pay | Admitting: Family Medicine

## 2017-10-20 MED ORDER — MECLIZINE HCL 25 MG PO TABS: 25.0000 mg | ORAL_TABLET | Freq: Two times a day (BID) | ORAL | 0 refills | Status: DC | PRN

## 2017-10-20 NOTE — Telephone Encounter (Signed)
Pt called and is requesting a refill on her meclizine pt uses [facility] [place], [address] [place] pt can be reached at [phone]

## 2017-10-20 NOTE — Telephone Encounter (Signed)
Please call and ask if she is taking this for vertigo symptoms.  How often is she needing this?  Okay to refill for her if this is the case and she is not needing it very often.

## 2017-10-20 NOTE — Telephone Encounter (Signed)
Pt states that she has had it more frequency the last month. Pt has scheduled an appt for next Thursday. Can you please look over med and since if this is the correct dose and send in to pharmacy since its not on her med list

## 2017-10-29 ENCOUNTER — Ambulatory Visit (INDEPENDENT_AMBULATORY_CARE_PROVIDER_SITE_OTHER): Payer: BLUE CROSS/BLUE SHIELD | Admitting: Family Medicine

## 2017-10-29 ENCOUNTER — Encounter: Payer: Self-pay | Admitting: Family Medicine

## 2017-10-29 VITALS — BP 120/80 | HR 68 | Temp 98.3°F | Ht 66.0 in | Wt 160.2 lb

## 2017-10-29 DIAGNOSIS — R42 Dizziness and giddiness: Secondary | ICD-10-CM | POA: Diagnosis not present

## 2017-10-29 NOTE — Progress Notes (Signed)
   Subjective:    Patient ID: Helen Walker, female    DOB: [birth year], 52 y.o.   MRN: [id number]  HPI Chief Complaint  Patient presents with  . vertigo    vertigo- been having them for the last month but not having a current flare up. needs rx    She is here with complaints of a vertigo flare up that lasted for approximately 3 weeks. States she felt dizzy when turning her head and laying down. She has been taking meclizine and symptoms resolved 3 days ago.  Reports history of similar flare up but did not last this long in the past.  States she has been traveling a lot of airplanes and in cars.  Denies fever, chills, headache, vision changes, URI symptoms. No chest pain, palpitations, shortness of breath, abdominal pain, N/V/D.  Denies tinnitus, numbness, tingling or weakness.   Her significant other is with her today and he is concerned that something bad could be causing her symptoms. States he would like for her to see a neurologist or ENT or have an MRI.   Reviewed allergies, medications, past medical, surgical, family, and social history.   Review of Systems Pertinent positives and negatives in the history of present illness.     Objective:   Physical Exam  Constitutional: She is oriented to person, place, and time. She appears well-developed and well-nourished. No distress.  HENT:  Nose: Nose normal.  Mouth/Throat: Uvula is midline and oropharynx is clear and moist.  Eyes: Pupils are equal, round, and reactive to light. Conjunctivae, EOM and lids are normal.  Neck: Normal range of motion.  Neurological: She is alert and oriented to person, place, and time. Gait normal.  Skin: Skin is warm and dry. No pallor.  Psychiatric: She has a normal mood and affect. Her speech is normal and behavior is normal. Thought content normal. Cognition and memory are normal.   BP 120/80   Pulse 68   Temp 98.3 F (36.8 C) (Oral)   Ht 5\' 6"  (1.676 m)   Wt 160 lb 3.2 oz (72.7 kg)   LMP  11/16/2014 (Approximate)   BMI 25.86 kg/m      Assessment & Plan:  Vertigo  Dizziness  No obvious infectious process or neurological event. Symptoms resolved and she has been back to her baseline for the past 3 days. Discussed that if her symptoms return that she should let us know and we can discuss further evaluation by ENT or PT. Discussed that if something bad or life threatening was occurring that her symptoms would not have resolved or be intermittent.  Recommend close follow up.

## 2017-12-09 ENCOUNTER — Other Ambulatory Visit: Payer: Self-pay | Admitting: Family Medicine

## 2017-12-09 DIAGNOSIS — Z1231 Encounter for screening mammogram for malignant neoplasm of breast: Secondary | ICD-10-CM

## 2018-01-06 ENCOUNTER — Ambulatory Visit (INDEPENDENT_AMBULATORY_CARE_PROVIDER_SITE_OTHER): Payer: BLUE CROSS/BLUE SHIELD | Admitting: Medical

## 2018-01-06 ENCOUNTER — Encounter: Payer: Self-pay | Admitting: Medical

## 2018-01-06 VITALS — BP 124/86 | HR 72 | Temp 97.6°F | Ht 67.0 in | Wt 166.2 lb

## 2018-01-06 DIAGNOSIS — M419 Scoliosis, unspecified: Secondary | ICD-10-CM | POA: Diagnosis not present

## 2018-01-06 DIAGNOSIS — M545 Low back pain, unspecified: Secondary | ICD-10-CM

## 2018-01-06 DIAGNOSIS — M5410 Radiculopathy, site unspecified: Secondary | ICD-10-CM | POA: Diagnosis not present

## 2018-01-06 MED ORDER — CYCLOBENZAPRINE HCL 10 MG PO TABS: ORAL_TABLET | ORAL | 0 refills | Status: DC

## 2018-01-06 MED ORDER — NAPROXEN 500 MG PO TABS: 500.0000 mg | ORAL_TABLET | Freq: Two times a day (BID) | ORAL | 0 refills | Status: DC

## 2018-01-06 NOTE — Progress Notes (Signed)
Subjective: Chief Complaint  Patient presents with  . other    pain in lower back running down left hip an leg numbness and tingling both feet strated a month ag and goten worse over the last week or two.    Here for c/o lower back pain, pain radiating down left leg, tinging in feet, swelling in feet.  Been going on a month, but symptom have worsened.  No recent injury, no trauma.   Has hx/o scoliosis from 52yo.    Exercises with waking regularly, sits ups, some calisthenics.  Is active.    No recent issues with bowels or bladder, no blood in stool or urine, no incontinent.    No fever, no recent weight loss.     Using tylenol for symptoms , naprosyn some.  No other aggravating or relieving factors. No other complaint.   Past Medical History:  Diagnosis Date  . Bladder mass 2013  . Medical history non-contributory    Current Outpatient Medications on File Prior to Visit  Medication Sig Dispense Refill  . acetaminophen (TYLENOL) 500 MG tablet Take 500 mg by mouth every 6 (six) hours as needed.    . meclizine (ANTIVERT) 25 MG tablet Take 1 tablet (25 mg total) by mouth 2 (two) times daily as needed for dizziness. 30 tablet 0  . valACYclovir (VALTREX) 500 MG tablet Take 1 tablet (500 mg total) by mouth 2 (two) times daily. X 3 days for flare up 60 tablet 1   No current facility-administered medications on file prior to visit.    Past Surgical History:  Procedure Laterality Date  . APPENDECTOMY N/A 01/23/2015   Procedure: APPENDECTOMY;  Surgeon: Terrance Mass, MD;  Location: [facility];  Service: Gynecology;  Laterality: N/A;  . [place]  . CYSTOSCOPY  07/11/2011   Procedure: CYSTOSCOPY;  Surgeon: Hanley Ben, MD;  Location: [facility];  Service: Urology;  Laterality: N/A;  TUR Bladder Mass  . CYSTOSCOPY W/ RETROGRADES  07/11/2011   Procedure: CYSTOSCOPY WITH RETROGRADE PYELOGRAM;  Surgeon: Hanley Ben, MD;  Location: [facility];  Service: Urology;  Laterality: Bilateral;  retrograde of bladder  . HYSTEROSCOPY W/ ENDOMETRIAL ABLATION  07-17-2006  . LAPAROSCOPIC ASSISTED VAGINAL HYSTERECTOMY N/A 01/23/2015   Procedure:  total abdominal hysterectomy , right salpingectomy oophorectomy;  Surgeon: Terrance Mass, MD;  Location: [facility];  Service: Gynecology;  Laterality: N/A;  request to follow first case  requests 2 1/2 hours OR time.  needs cystoscope and harmonic scalpel  . LAPAROSCOPIC LYSIS OF ADHESIONS N/A 01/23/2015   Procedure: LAPAROSCOPIC LYSIS OF ADHESIONS;  Surgeon: Terrance Mass, MD;  Location: [facility];  Service: Gynecology;  Laterality: N/A;  . TRANSURETHRAL RESECTION OF BLADDER TUMOR  07/11/2011   Procedure: TRANSURETHRAL RESECTION OF BLADDER TUMOR (TURBT);  Surgeon: Hanley Ben, MD;  Location: [facility];  Service: Urology;  Laterality: N/A;  . TUBAL LIGATION  1995     ROS as in subjective   Objective: BP 124/86 (BP Location: Left Arm, Patient Position: Sitting)   Pulse 72   Temp 97.6 F (36.4 C)   Ht 5\' 7"  (1.702 m)   Wt 166 lb 3.2 oz (75.4 kg)   LMP 11/16/2014 (Approximate)   SpO2 99%   BMI 26.03 kg/m    Wt Readings from Last 3 Encounters:  01/06/18 166 lb 3.2 oz (75.4 kg)  10/29/17 160 lb 3.2 oz (72.7 kg)  07/17/17 160 lb  6.4 oz (72.8 kg)    General: Well-developed well-nourished no acute distress, AA female Mild midline and right paraspinal muscle tenderness in the lumbar area, mild to moderate scoliosis noted, and due to the scoliosis right leg seems to be shorter than left subtlety, otherwise back nontender Legs nontender, normal range of motion, negative straight leg raise Legs neurovascularly intact Abdomen nontender, no mass no organomegaly Extremities with no edema   Assessment: Encounter Diagnoses  Name Primary?  . Lumbar pain Yes  . Radicular pain of lower extremity   . Scoliosis, unspecified scoliosis type, unspecified spinal region       Plan: Begin medication as below, referral to physical therapy and will proceed to get an MRI.  Discussed stretching, avoid heavy lifting, discussed some home exercise program  Helen Walker was seen today for other.  Diagnoses and all orders for this visit:  Lumbar pain -     MR Lumbar Spine Wo Contrast; Future -     Ambulatory referral to Physical Therapy  Radicular pain of lower extremity -     MR Lumbar Spine Wo Contrast; Future -     Ambulatory referral to Physical Therapy  Scoliosis, unspecified scoliosis type, unspecified spinal region -     MR Lumbar Spine Wo Contrast; Future -     Ambulatory referral to Physical Therapy  Other orders -     naproxen (NAPROSYN) 500 MG tablet; Take 1 tablet (500 mg total) by mouth 2 (two) times daily with a meal. -     cyclobenzaprine (FLEXERIL) 10 MG tablet; 1/2-1 tablet po QHS prn

## 2018-01-07 ENCOUNTER — Telehealth: Payer: Self-pay

## 2018-01-07 NOTE — Telephone Encounter (Signed)
Pt will need a peer to peer for her MRI . Their number is [phone]. They are needing mor info . Pt ID number is [id number] . Please advise when done [facility]

## 2018-01-11 ENCOUNTER — Other Ambulatory Visit: Payer: Self-pay

## 2018-01-11 ENCOUNTER — Ambulatory Visit: Payer: BLUE CROSS/BLUE SHIELD | Attending: Medical

## 2018-01-11 DIAGNOSIS — M5442 Lumbago with sciatica, left side: Secondary | ICD-10-CM | POA: Insufficient documentation

## 2018-01-11 DIAGNOSIS — M25652 Stiffness of left hip, not elsewhere classified: Secondary | ICD-10-CM | POA: Insufficient documentation

## 2018-01-11 DIAGNOSIS — G8929 Other chronic pain: Secondary | ICD-10-CM

## 2018-01-11 DIAGNOSIS — M25651 Stiffness of right hip, not elsewhere classified: Secondary | ICD-10-CM | POA: Diagnosis not present

## 2018-01-11 DIAGNOSIS — M62830 Muscle spasm of back: Secondary | ICD-10-CM

## 2018-01-11 DIAGNOSIS — R293 Abnormal posture: Secondary | ICD-10-CM | POA: Insufficient documentation

## 2018-01-11 NOTE — Patient Instructions (Signed)
BACK: Child's Pose (Sciatica)    Sit in knee-chest position and reach arms forward. Separate knees for comfort. Hold position for _15__ breaths. Repeat _2-3__ times. Do _2-3Spinal Mobility (Cat / Camel): Flexion / Extension    Get on hands and knees 1.Cat: Buttocks up, arch spine segmentally, bottom to top: lift chest as head moves back, look up. 2.Camel: Reverse movement. Close eyes, lower head, tuck chin, compress chest and abdomen, round back. Hold 1-2___ seconds. Repeat _10__ times. Do __2_ sessions per day. NO ROLLERSHip Flexors (Supine)    Lie with both legs bent over edge of firm surface. To stretch left hip, bring opposite knee to chest. Apply downward pressure to leg hanging over edge. Do not allow hips to roll up. Do not let knees change position. Hold _15-30 SEC___ seconds. Repeat __3__ times. Do __2__ sessions per day. CAUTION: Stretch should be gentle, steady and slow.  Copyright  VHI. All rights reserved.   Copyright  VHI. All rights reserved.  __ times per day.  Copyright  VHI. All rights reserved.

## 2018-01-11 NOTE — Therapy (Signed)
[place], [place], [address] Phone: [phone]   Fax:  [phone]  Physical Therapy Evaluation  Patient Details  Name: Helen Walker MRN: [id number] Date of Birth: [birth year] Referring Provider: Chana Bode, PA   Encounter Date: 01/11/2018  PT End of Session - 01/11/18 1601    Visit Number  1    Number of Visits  12    Date for PT Re-Evaluation  02/19/18    Authorization Type  BCBS    PT Start Time  0345    PT Stop Time  0430    PT Time Calculation (min)  45 min    Activity Tolerance  Patient tolerated treatment well       Past Medical History:  Diagnosis Date  . Bladder mass 2013  . Medical history non-contributory     Past Surgical History:  Procedure Laterality Date  . APPENDECTOMY N/A 01/23/2015   Procedure: APPENDECTOMY;  Surgeon: Terrance Mass, MD;  Location: [facility];  Service: Gynecology;  Laterality: N/A;  . [place]  . CYSTOSCOPY  07/11/2011   Procedure: CYSTOSCOPY;  Surgeon: Hanley Ben, MD;  Location: [facility];  Service: Urology;  Laterality: N/A;  TUR Bladder Mass  . CYSTOSCOPY W/ RETROGRADES  07/11/2011   Procedure: CYSTOSCOPY WITH RETROGRADE PYELOGRAM;  Surgeon: Hanley Ben, MD;  Location: [facility];  Service: Urology;  Laterality: Bilateral;  retrograde of bladder  . HYSTEROSCOPY W/ ENDOMETRIAL ABLATION  07-17-2006  . LAPAROSCOPIC ASSISTED VAGINAL HYSTERECTOMY N/A 01/23/2015   Procedure:  total abdominal hysterectomy , right salpingectomy oophorectomy;  Surgeon: Terrance Mass, MD;  Location: [facility];  Service: Gynecology;  Laterality: N/A;  request to follow first case  requests 2 1/2 hours OR time.  needs cystoscope and harmonic scalpel  . LAPAROSCOPIC LYSIS OF ADHESIONS N/A 01/23/2015   Procedure: LAPAROSCOPIC LYSIS OF ADHESIONS;  Surgeon: Terrance Mass, MD;  Location: [facility];  Service: Gynecology;   Laterality: N/A;  . TRANSURETHRAL RESECTION OF BLADDER TUMOR  07/11/2011   Procedure: TRANSURETHRAL RESECTION OF BLADDER TUMOR (TURBT);  Surgeon: Hanley Ben, MD;  Location: [facility];  Service: Urology;  Laterality: N/A;  . TUBAL LIGATION  1995    There were no vitals filed for this visit.   Subjective Assessment - 01/11/18 1554    Subjective  She reports RT sided back pain and hip and tingle and numbness in whole LT leg and foot.  Possible nerve inflamation.   She started running 2016 and after period of time she began to ahve pain  and worsening in  pain in Lt leg and LBP.       Patient is accompained by:  Family member    Limitations  -- getting out of bed, no running    How long can you sit comfortably?  60 min    How long can you stand comfortably?  10 min    How long can you walk comfortably?  20 min    Diagnostic tests  Xray: 06/2017    Patient Stated Goals  She wants to have exercies to ease pain    Currently in Pain?  Yes    Pain Score  4     Pain Location  Back    Pain Orientation  Lower;Right    Pain Descriptors / Indicators  Pins and needles;Numbness;Dull    Pain Type  Chronic pain    Pain  Onset  More than a month ago    Pain Frequency  Intermittent    Aggravating Factors   getting out of bed, standing, walking    Pain Relieving Factors  medication , elevate feet, heat         [facility] PT Assessment - 01/11/18 0001      Assessment   Medical Diagnosis  radicular pain /scoliosis    Referring Provider  Chana Bode, PA    Onset Date/Surgical Date  -- over a year or so. Worse in past 2  months with Lt leg radic    Next MD Visit  as needed    Prior Therapy  No      Precautions   Precautions  None      Restrictions   Weight Bearing Restrictions  No      Balance Screen   Has the patient fallen in the past 6 months  No    Has the patient had a decrease in activity level because of a fear of falling?   No    Is the patient reluctant to leave  their home because of a fear of falling?   No      Prior Function   Level of Independence  Independent      Cognition   Overall Cognitive Status  Within Functional Limits for tasks assessed      Observation/Other Assessments   Focus on Therapeutic Outcomes (FOTO)   46% limited      Posture/Postural Control   Posture Comments  scoliosis, LT ilia higher than RT. increased lordosis      ROM / Strength   AROM / PROM / Strength  AROM;Strength;PROM      AROM   AROM Assessment Site  Lumbar    Lumbar Flexion  30 pull    Lumbar Extension  5 less tight    Lumbar - Right Side Bend  5    Lumbar - Left Side Bend  10    Lumbar - Right Rotation  45 pain in back /hip laterally    Lumbar - Left Rotation  60      PROM   Overall PROM Comments  Prone knee flexion RT 135  LT 126,   hip ER RT  38  LT  32,   IR   RT   28  LT  42        Strength   Overall Strength Comments  LE WNL       Flexibility   Soft Tissue Assessment /Muscle Length  yes    Hamstrings  Negative SLR at 50 degrees bilaterally                Objective measurements completed on examination: See above findings.      [facility] Adult PT Treatment/Exercise - 01/11/18 0001      Exercises   Exercises  Lumbar      Lumbar Exercises: Stretches   Other Lumbar Stretch Exercise  Marcello Moores  and  childs pose  cat camel               PT Education - 01/11/18 1601    Education Details  POC , HEP    Person(s) Educated  Patient    Methods  Explanation;Tactile cues;Verbal cues;Handout    Comprehension  Verbalized understanding;Returned demonstration       PT Short Term Goals - 01/11/18 1643      PT SHORT TERM GOAL #1   Title  She will be independent  with  HEP    Time  3    Period  Weeks    Status  New      PT SHORT TERM GOAL #2   Title  she will report no stiffness in AM getting out of bed    Time  3    Period  Weeks    Status  New      PT SHORT TERM GOAL #3   Title  she will report overall pain decr 30% or  more    Time  3    Period  Weeks    Status  New      PT SHORT TERM GOAL #4   Title  She will report benefit with decr pain with heel lift    Time  3    Period  Weeks    Status  New        PT Long Term Goals - 01/11/18 1645      PT LONG TERM GOAL #1   Title  She will be independent with all hEP issued    Time  6    Period  Weeks    Status  New      PT LONG TERM GOAL #2   Title  She will report pain decreased 74% or more with all normal activity.      Time  6    Period  Weeks    Status  New      PT LONG TERM GOAL #3   Title  FOTO score will improve to  32 %  limited  to demo improved function per pt    Time  6    Period  Weeks    Status  New      PT LONG TERM GOAL #4   Title  She will return to exerciseing without pain    Time  6    Period  Weeks    Status  New             Plan - 01/11/18 1602    Clinical Impression Statement  Ms Mooradian presents with chronic back pain on RT with parasthesisas LT leg  to foot. She has significant scoliosis.  She should improve with PT with emphasis on stretching, manual     Clinical Presentation  Stable    Clinical Decision Making  Low    Rehab Potential  Good    PT Frequency  2x / week    PT Duration  6 weeks    PT Treatment/Interventions  Dry needling;Patient/family education;Therapeutic exercise;Therapeutic activities;Moist Heat;Ultrasound;Manual techniques;Passive range of motion    PT Next Visit Plan  Review HEp and add as needed , Manual for STW , mobs/stretch,  modalities as needed    PT Home Exercise Plan  Ardine Eng pose, thomas , cat camel    Consulted and Agree with Plan of Care  Patient       Patient will benefit from skilled therapeutic intervention in order to improve the following deficits and impairments:  Pain, Decreased range of motion, Postural dysfunction, Increased muscle spasms, Decreased activity tolerance  Visit Diagnosis: Chronic right-sided low back pain with left-sided sciatica  Abnormal  posture  Muscle spasm of back  Stiffness of right hip, not elsewhere classified  Stiffness of left hip, not elsewhere classified     Problem List Patient Active Problem List   Diagnosis Date Noted  . Prediabetes 10/25/2015  . Encounter for postoperative care 01/23/2015  . Genital herpes 11/25/2012  [name]  PT 01/11/2018, 5:02 PM  [facility] [address] [place], [place], [address] Phone: [phone]   Fax:  [phone]  Name: Helen Walker MRN: [id number] Date of Birth: 1965-10-29

## 2018-01-11 NOTE — Telephone Encounter (Signed)
I called and left message on peer review extension

## 2018-01-12 NOTE — Telephone Encounter (Signed)
Helen Walker called [phone] between 10-4 Central Time. Call for peer to peer but couldn't leave any name due to [employer].  Please try to call him back.

## 2018-01-13 NOTE — Telephone Encounter (Signed)
Patient notified of denial of MRI

## 2018-01-13 NOTE — Telephone Encounter (Signed)
Insurance has declined MRI.   We can try again after trial of physical therapy

## 2018-01-18 ENCOUNTER — Ambulatory Visit
Admission: RE | Admit: 2018-01-18 | Discharge: 2018-01-18 | Disposition: A | Discharge status: Home / Self Care | Payer: BLUE CROSS/BLUE SHIELD | Source: Ambulatory Visit | Attending: Family Medicine | Admitting: Family Medicine

## 2018-01-18 DIAGNOSIS — Z1231 Encounter for screening mammogram for malignant neoplasm of breast: Secondary | ICD-10-CM | POA: Diagnosis not present

## 2018-01-19 ENCOUNTER — Ambulatory Visit: Payer: BLUE CROSS/BLUE SHIELD

## 2018-01-19 DIAGNOSIS — M25651 Stiffness of right hip, not elsewhere classified: Secondary | ICD-10-CM | POA: Diagnosis not present

## 2018-01-19 DIAGNOSIS — M5442 Lumbago with sciatica, left side: Secondary | ICD-10-CM | POA: Diagnosis not present

## 2018-01-19 DIAGNOSIS — M62830 Muscle spasm of back: Secondary | ICD-10-CM | POA: Diagnosis not present

## 2018-01-19 DIAGNOSIS — G8929 Other chronic pain: Secondary | ICD-10-CM | POA: Diagnosis not present

## 2018-01-19 DIAGNOSIS — M25652 Stiffness of left hip, not elsewhere classified: Secondary | ICD-10-CM | POA: Diagnosis not present

## 2018-01-19 DIAGNOSIS — R293 Abnormal posture: Secondary | ICD-10-CM

## 2018-01-19 NOTE — Patient Instructions (Signed)
Issued childs pose with side bend , figure 4 supine and prone and side lye stretch QL.  2-3 reps RT /Lt  30-60 sec

## 2018-01-19 NOTE — Therapy (Signed)
[place], [place], [address] Phone: [phone]   Fax:  [phone]  Physical Therapy Treatment  Patient Details  Name: Helen Walker MRN: [id number] Date of Birth: 1965/07/18 Referring Provider: Chana Bode, PA   Encounter Date: 01/19/2018  PT End of Session - 01/19/18 0704    Visit Number  2    Number of Visits  12    Date for PT Re-Evaluation  02/19/18    Authorization Type  BCBS    PT Start Time  0700    PT Stop Time  0748    PT Time Calculation (min)  48 min    Activity Tolerance  Patient tolerated treatment well    Behavior During Therapy  [facility] for tasks assessed/performed       Past Medical History:  Diagnosis Date  . Bladder mass 2013  . Medical history non-contributory     Past Surgical History:  Procedure Laterality Date  . APPENDECTOMY N/A 01/23/2015   Procedure: APPENDECTOMY;  Surgeon: Terrance Mass, MD;  Location: [facility];  Service: Gynecology;  Laterality: N/A;  . [place]  . CYSTOSCOPY  07/11/2011   Procedure: CYSTOSCOPY;  Surgeon: Hanley Ben, MD;  Location: [facility];  Service: Urology;  Laterality: N/A;  TUR Bladder Mass  . CYSTOSCOPY W/ RETROGRADES  07/11/2011   Procedure: CYSTOSCOPY WITH RETROGRADE PYELOGRAM;  Surgeon: Hanley Ben, MD;  Location: [facility];  Service: Urology;  Laterality: Bilateral;  retrograde of bladder  . HYSTEROSCOPY W/ ENDOMETRIAL ABLATION  07-17-2006  . LAPAROSCOPIC ASSISTED VAGINAL HYSTERECTOMY N/A 01/23/2015   Procedure:  total abdominal hysterectomy , right salpingectomy oophorectomy;  Surgeon: Terrance Mass, MD;  Location: [facility];  Service: Gynecology;  Laterality: N/A;  request to follow first case  requests 2 1/2 hours OR time.  needs cystoscope and harmonic scalpel  . LAPAROSCOPIC LYSIS OF ADHESIONS N/A 01/23/2015   Procedure: LAPAROSCOPIC LYSIS OF ADHESIONS;  Surgeon: Terrance Mass, MD;  Location: [facility];  Service: Gynecology;  Laterality: N/A;  . TRANSURETHRAL RESECTION OF BLADDER TUMOR  07/11/2011   Procedure: TRANSURETHRAL RESECTION OF BLADDER TUMOR (TURBT);  Surgeon: Hanley Ben, MD;  Location: [facility];  Service: Urology;  Laterality: N/A;  . TUBAL LIGATION  1995    There were no vitals filed for this visit.  Subjective Assessment - 01/19/18 0708    Subjective  A little bit of pain , 5/10    Pain Score  5     Pain Location  Back    Pain Orientation  Lower;Right    Pain Descriptors / Indicators  Dull;Pins and needles    Pain Onset  More than a month ago    Pain Frequency  Intermittent    Aggravating Factors   AM , standing and walking    Pain Relieving Factors  meds heat                       [facility] Adult PT Treatment/Exercise - 01/19/18 0001      Lumbar Exercises: Stretches   Hip Flexor Stretch  Right;Left;2 reps;30 seconds supine then x1 each with  overpressure 30 sec    Figure 4 Stretch  30 seconds    Other Lumbar Stretch Exercise  childs pose x 30 then to RT and Lt x 30 sec cued for alighnment.       Lumbar Exercises: Aerobic  Nustep  L4 UE and LE 5 min      Lumbar Exercises: Quadruped   [place]/Old Horse  10 reps      Manual Therapy   Manual Therapy  Passive ROM;Joint mobilization    Joint Mobilization  PA hip mobs gr 3-4 x 75 RT /LT     Passive ROM  Quads and hip ER  60 sec RT/LT              PT Education - 01/19/18 0709    Education Details  pain scale of min /mod /severe with a little being min, HEP Much time spent on instruction for HEP   Person(s) Educated  Patient    Methods  Explanation;Demonstration;Tactile cues;Verbal cues;Handout    Comprehension  Verbalized understanding;Returned demonstration       PT Short Term Goals - 01/11/18 1643      PT SHORT TERM GOAL #1   Title  She will be independent with  HEP    Time  3    Period  Weeks    Status  New      PT SHORT TERM  GOAL #2   Title  she will report no stiffness in AM getting out of bed    Time  3    Period  Weeks    Status  New      PT SHORT TERM GOAL #3   Title  she will report overall pain decr 30% or more    Time  3    Period  Weeks    Status  New      PT SHORT TERM GOAL #4   Title  She will report benefit with decr pain with heel lift    Time  3    Period  Weeks    Status  New        PT Long Term Goals - 01/11/18 1645      PT LONG TERM GOAL #1   Title  She will be independent with all hEP issued    Time  6    Period  Weeks    Status  New      PT LONG TERM GOAL #2   Title  She will report pain decreased 74% or more with all normal activity.      Time  6    Period  Weeks    Status  New      PT LONG TERM GOAL #3   Title  FOTO score will improve to  32 %  limited  to demo improved function per pt    Time  6    Period  Weeks    Status  New      PT LONG TERM GOAL #4   Title  She will return to exerciseing without pain    Time  6    Period  Weeks    Status  New            Plan - 01/19/18 7619    Clinical Impression Statement  She reported feeling better post session . Agreed to new stretches.   Progress stretching and some core strength     PT Treatment/Interventions  Dry needling;Patient/family education;Therapeutic exercise;Therapeutic activities;Moist Heat;Ultrasound;Manual techniques;Passive range of motion    PT Next Visit Plan  Review HEp and add as needed , Manual for STW , mobs/stretch,  modalities as needed, basic core strength    PT Home Exercise Plan  Ardine Eng pose, thomas , cat camel  Consulted and Agree with Plan of Care  Patient       Patient will benefit from skilled therapeutic intervention in order to improve the following deficits and impairments:  Pain, Decreased range of motion, Postural dysfunction, Increased muscle spasms, Decreased activity tolerance  Visit Diagnosis: Chronic right-sided low back pain with left-sided sciatica  Abnormal  posture  Muscle spasm of back  Stiffness of right hip, not elsewhere classified  Stiffness of left hip, not elsewhere classified     Problem List Patient Active Problem List   Diagnosis Date Noted  . Prediabetes 10/25/2015  . Encounter for postoperative care 01/23/2015  . Genital herpes 11/25/2012    [name]  PT 01/19/2018, 7:46 AM  [facility] [address] [place], [place], [address] Phone: [phone]   Fax:  [phone]  Name: Helen Walker MRN: [id number] Date of Birth: 10-25-1965

## 2018-01-21 ENCOUNTER — Ambulatory Visit: Payer: BLUE CROSS/BLUE SHIELD

## 2018-01-21 DIAGNOSIS — R293 Abnormal posture: Secondary | ICD-10-CM | POA: Diagnosis not present

## 2018-01-21 DIAGNOSIS — M62830 Muscle spasm of back: Secondary | ICD-10-CM | POA: Diagnosis not present

## 2018-01-21 DIAGNOSIS — M25651 Stiffness of right hip, not elsewhere classified: Secondary | ICD-10-CM

## 2018-01-21 DIAGNOSIS — M25652 Stiffness of left hip, not elsewhere classified: Secondary | ICD-10-CM

## 2018-01-21 DIAGNOSIS — G8929 Other chronic pain: Secondary | ICD-10-CM

## 2018-01-21 DIAGNOSIS — M5442 Lumbago with sciatica, left side: Principal | ICD-10-CM

## 2018-01-21 NOTE — Therapy (Signed)
[place], [place], [address] Phone: [phone]   Fax:  [phone]  Physical Therapy Treatment  Patient Details  Name: Helen Walker MRN: [id number] Date of Birth: [birth year] Referring Provider: Chana Bode, PA   Encounter Date: 01/21/2018  PT End of Session - 01/21/18 0659    Visit Number  3    Number of Visits  12    Date for PT Re-Evaluation  02/19/18    Authorization Type  BCBS    PT Start Time  0655    PT Stop Time  0740    PT Time Calculation (min)  45 min    Activity Tolerance  Patient tolerated treatment well    Behavior During Therapy  [facility] for tasks assessed/performed       Past Medical History:  Diagnosis Date  . Bladder mass 2013  . Medical history non-contributory     Past Surgical History:  Procedure Laterality Date  . APPENDECTOMY N/A 01/23/2015   Procedure: APPENDECTOMY;  Surgeon: Terrance Mass, MD;  Location: [facility];  Service: Gynecology;  Laterality: N/A;  . [place]  . CYSTOSCOPY  07/11/2011   Procedure: CYSTOSCOPY;  Surgeon: Hanley Ben, MD;  Location: [facility];  Service: Urology;  Laterality: N/A;  TUR Bladder Mass  . CYSTOSCOPY W/ RETROGRADES  07/11/2011   Procedure: CYSTOSCOPY WITH RETROGRADE PYELOGRAM;  Surgeon: Hanley Ben, MD;  Location: [place];  Service: Urology;  Laterality: Bilateral;  retrograde of bladder  . HYSTEROSCOPY W/ ENDOMETRIAL ABLATION  07-17-2006  . LAPAROSCOPIC ASSISTED VAGINAL HYSTERECTOMY N/A 01/23/2015   Procedure:  total abdominal hysterectomy , right salpingectomy oophorectomy;  Surgeon: Terrance Mass, MD;  Location: [facility];  Service: Gynecology;  Laterality: N/A;  request to follow first case  requests 2 1/2 hours OR time.  needs cystoscope and harmonic scalpel  . LAPAROSCOPIC LYSIS OF ADHESIONS N/A 01/23/2015   Procedure: LAPAROSCOPIC LYSIS OF ADHESIONS;  Surgeon: Terrance Mass, MD;  Location: [facility];  Service: Gynecology;  Laterality: N/A;  . TRANSURETHRAL RESECTION OF BLADDER TUMOR  07/11/2011   Procedure: TRANSURETHRAL RESECTION OF BLADDER TUMOR (TURBT);  Surgeon: Hanley Ben, MD;  [facility];  Service: Urology;  Laterality: N/A;  . TUBAL LIGATION  1995    There were no vitals filed for this visit.  Subjective Assessment - 01/21/18 0659    Subjective  3/10 today    Pain Score  3     Pain Location  Back    Pain Orientation  Right;Lower                       [facility] Adult PT Treatment/Exercise - 01/21/18 0001      Lumbar Exercises: Stretches   Hip Flexor Stretch  Right;Left;3 reps;30 seconds    Figure 4 Stretch  30 seconds    Other Lumbar Stretch Exercise  childs pose x 30 then to RT and Lt x 30 sec cued for alighnment.       Lumbar Exercises: Aerobic   Recumbent Bike  L# 5 min      Lumbar Exercises: Supine   Pelvic Tilt  15 reps    Clam  10 reps with PPT    Bent Knee Raise  10 reps with PPT    Other Supine Lumbar Exercises  Spent much of time  with control of lower abdominals and pelvic floor and  adding breathing  with movements.       Lumbar Exercises: Quadruped   Madcat/Old Horse  10 reps             PT Education - 01/21/18 0741    Education Details  HEP    Person(s) Educated  Patient    Methods  Explanation;Demonstration;Tactile cues;Verbal cues;Handout    Comprehension  Returned demonstration;Verbalized understanding       PT Short Term Goals - 01/11/18 1643      PT SHORT TERM GOAL #1   Title  She will be independent with  HEP    Time  3    Period  Weeks    Status  New      PT SHORT TERM GOAL #2   Title  she will report no stiffness in AM getting out of bed    Time  3    Period  Weeks    Status  New      PT SHORT TERM GOAL #3   Title  she will report overall pain decr 30% or more    Time  3    Period  Weeks    Status  New      PT SHORT TERM GOAL #4   Title  She  will report benefit with decr pain with heel lift    Time  3    Period  Weeks    Status  New        PT Long Term Goals - 01/11/18 1645      PT LONG TERM GOAL #1   Title  She will be independent with all hEP issued    Time  6    Period  Weeks    Status  New      PT LONG TERM GOAL #2   Title  She will report pain decreased 74% or more with all normal activity.      Time  6    Period  Weeks    Status  New      PT LONG TERM GOAL #3   Title  FOTO score will improve to  32 %  limited  to demo improved function per pt    Time  6    Period  Weeks    Status  New      PT LONG TERM GOAL #4   Title  She will return to exerciseing without pain    Time  6    Period  Weeks    Status  New            Plan - 01/21/18 3664    Clinical Impression Statement  Declined modalites as she felt good post session . She did well with ab control though can improve.     PT Treatment/Interventions  Dry needling;Patient/family education;Therapeutic exercise;Therapeutic activities;Moist Heat;Ultrasound;Manual techniques;Passive range of motion    PT Next Visit Plan  Review HEp and add as needed , Manual for STW , mobs/stretch,  modalities as needed, basic core strength    PT Home Exercise Plan  Ardine Eng pose, thomas , cat camel,  transverse ab and pelvic flor exercises     Consulted and Agree with Plan of Care  Patient       Patient will benefit from skilled therapeutic intervention in order to improve the following deficits and impairments:  Pain, Decreased range of motion, Postural dysfunction, Increased muscle spasms, Decreased activity tolerance  Visit Diagnosis: Chronic right-sided low back pain with left-sided sciatica  Abnormal posture  Muscle spasm of back  Stiffness of right hip, not elsewhere classified  Stiffness of left hip, not elsewhere classified     Problem List Patient Active Problem List   Diagnosis Date Noted  . Prediabetes 10/25/2015  . Encounter for  postoperative care 01/23/2015  . Genital herpes 11/25/2012    Helen Walker  PT 01/21/2018, 7:53 AM  [facility] [address] [place], [place], [address] Phone: [phone]   Fax:  [phone]  Name: Helen Walker MRN: [id number] Date of Birth: 1966/03/27

## 2018-01-21 NOTE — Patient Instructions (Signed)
Issued Transverse abd and pelvic floor with breathing  For HEP with leg movements  And also with PPT 5-10 reps 1-2x/day

## 2018-01-28 ENCOUNTER — Ambulatory Visit: Payer: BLUE CROSS/BLUE SHIELD | Attending: Medical | Admitting: Physical Therapy

## 2018-01-28 ENCOUNTER — Encounter: Payer: Self-pay | Admitting: Physical Therapy

## 2018-01-28 DIAGNOSIS — M25652 Stiffness of left hip, not elsewhere classified: Secondary | ICD-10-CM | POA: Insufficient documentation

## 2018-01-28 DIAGNOSIS — G8929 Other chronic pain: Secondary | ICD-10-CM | POA: Diagnosis not present

## 2018-01-28 DIAGNOSIS — M62830 Muscle spasm of back: Secondary | ICD-10-CM

## 2018-01-28 DIAGNOSIS — M5442 Lumbago with sciatica, left side: Secondary | ICD-10-CM | POA: Diagnosis not present

## 2018-01-28 DIAGNOSIS — M25651 Stiffness of right hip, not elsewhere classified: Secondary | ICD-10-CM | POA: Diagnosis not present

## 2018-01-28 DIAGNOSIS — R293 Abnormal posture: Secondary | ICD-10-CM | POA: Insufficient documentation

## 2018-01-28 NOTE — Therapy (Signed)
[place], [place], [address] Phone: [phone]   Fax:  [phone]  Physical Therapy Treatment  Patient Details  Name: Helen Walker MRN: [id number] Date of Birth: 23-Apr-1966 Referring Provider: Chana Bode, PA   Encounter Date: 01/28/2018  PT End of Session - 01/28/18 0736    Visit Number  4    Number of Visits  12    Date for PT Re-Evaluation  02/19/18    Authorization Type  BCBS    PT Start Time  0724    PT Stop Time  0803    PT Time Calculation (min)  39 min       Past Medical History:  Diagnosis Date  . Bladder mass 2013  . Medical history non-contributory     Past Surgical History:  Procedure Laterality Date  . APPENDECTOMY N/A 01/23/2015   Procedure: APPENDECTOMY;  Surgeon: Terrance Mass, MD;  Location: [facility];  Service: Gynecology;  Laterality: N/A;  . [place]  . CYSTOSCOPY  07/11/2011   Procedure: CYSTOSCOPY;  Surgeon: Hanley Ben, MD;  Location: [facility];  Service: Urology;  Laterality: N/A;  TUR Bladder Mass  . CYSTOSCOPY W/ RETROGRADES  07/11/2011   Procedure: CYSTOSCOPY WITH RETROGRADE PYELOGRAM;  Surgeon: Hanley Ben, MD;  Location: [facility];  Service: Urology;  Laterality: Bilateral;  retrograde of bladder  . HYSTEROSCOPY W/ ENDOMETRIAL ABLATION  07-17-2006  . LAPAROSCOPIC ASSISTED VAGINAL HYSTERECTOMY N/A 01/23/2015   Procedure:  total abdominal hysterectomy , right salpingectomy oophorectomy;  Surgeon: Terrance Mass, MD;  Location: [facility];  Service: Gynecology;  Laterality: N/A;  request to follow first case  requests 2 1/2 hours OR time.  needs cystoscope and harmonic scalpel  . LAPAROSCOPIC LYSIS OF ADHESIONS N/A 01/23/2015   Procedure: LAPAROSCOPIC LYSIS OF ADHESIONS;  Surgeon: Terrance Mass, MD;  Location: [facility];  Service: Gynecology;  Laterality: N/A;  . TRANSURETHRAL RESECTION OF BLADDER TUMOR   07/11/2011   Procedure: TRANSURETHRAL RESECTION OF BLADDER TUMOR (TURBT);  Surgeon: Hanley Ben, MD;  Location: [facility];  Service: Urology;  Laterality: N/A;  . TUBAL LIGATION  1995    There were no vitals filed for this visit.  Subjective Assessment - 01/28/18 0727    Subjective  Woke up this morning without pain. Had a little bit of pain after work yesterday. Still have back pain if I do not getup and move around at work.     Currently in Pain?  No/denies                       [facility] Adult PT Treatment/Exercise - 01/28/18 0001      Lumbar Exercises: Stretches   Single Knee to Chest Stretch  3 reps;30 seconds    Prone on Elbows Stretch  -- for abdominal stretch    Other Lumbar Stretch Exercise  childs pose x 60 sec       Lumbar Exercises: Aerobic   Recumbent Bike  L3 5 min      Lumbar Exercises: Supine   Pelvic Tilt  15 reps    Clam  10 reps;20 reps with PPT    Clam Limitations  single leg control, double leg, then double with green band     Bent Knee Raise  10 reps with PPT    Bent Knee Raise Limitations  progressed to table top and one foot lowers  at a time without pain.     Other Supine Lumbar Exercises  Spent much of time  with control of lower abdominals and pelvic floor and adding breathing  with movements.       Lumbar Exercises: Quadruped   Madcat/Old Horse  10 reps    Straight Leg Raise  10 reps cues for neutral and abdominal brace                PT Short Term Goals - 01/28/18 0730      PT SHORT TERM GOAL #1   Title  She will be independent with  HEP    Period  Weeks    Status  Achieved      PT SHORT TERM GOAL #2   Title  she will report no stiffness in AM getting out of bed    Baseline  no pain today getting up    Time  3    Period  Weeks    Status  Partially Met      PT SHORT TERM GOAL #3   Title  she will report overall pain decr 30% or more    Time  3    Period  Weeks    Status  Achieved      PT SHORT  TERM GOAL #4   Title  She will report benefit with decr pain with heel lift    Time  3    Period  Weeks    Status  Achieved        PT Long Term Goals - 01/11/18 1645      PT LONG TERM GOAL #1   Title  She will be independent with all hEP issued    Time  6    Period  Weeks    Status  New      PT LONG TERM GOAL #2   Title  She will report pain decreased 74% or more with all normal activity.      Time  6    Period  Weeks    Status  New      PT LONG TERM GOAL #3   Title  FOTO score will improve to  32 %  limited  to demo improved function per pt    Time  6    Period  Weeks    Status  New      PT LONG TERM GOAL #4   Title  She will return to exerciseing without pain    Time  6    Period  Weeks    Status  New            Plan - 01/28/18 0350    Clinical Impression Statement  Pt reports no back pain this morning upon waking. Focused abdominal control. Added band for clams and began Qudruped core with cues for abdominal brace and neutral spine.     PT Next Visit Plan  Review HEp and add as needed , Manual for STW , mobs/stretch,  modalities as needed, basic core strength    PT Home Exercise Plan  Ardine Eng pose, thomas , cat camel,  transverse ab and pelvic flor exercises     Consulted and Agree with Plan of Care  Patient       Patient will benefit from skilled therapeutic intervention in order to improve the following deficits and impairments:  Pain, Decreased range of motion, Postural dysfunction, Increased muscle spasms, Decreased activity tolerance  Visit Diagnosis: Chronic right-sided low back  pain with left-sided sciatica  Abnormal posture  Muscle spasm of back  Stiffness of right hip, not elsewhere classified  Stiffness of left hip, not elsewhere classified     Problem List Patient Active Problem List   Diagnosis Date Noted  . Prediabetes 10/25/2015  . Encounter for postoperative care 01/23/2015  . Genital herpes 11/25/2012    Dorene Ar, PTA 01/28/2018, 8:15 AM  [facility] [address] [place], [place], [address] Phone: [phone]   Fax:  [phone]  Name: Helen Walker MRN: [id number] Date of Birth: 13-Oct-1965

## 2018-01-29 ENCOUNTER — Ambulatory Visit: Payer: BLUE CROSS/BLUE SHIELD | Admitting: Physical Therapy

## 2018-01-29 ENCOUNTER — Encounter: Payer: Self-pay | Admitting: Physical Therapy

## 2018-01-29 DIAGNOSIS — M25652 Stiffness of left hip, not elsewhere classified: Secondary | ICD-10-CM | POA: Diagnosis not present

## 2018-01-29 DIAGNOSIS — G8929 Other chronic pain: Secondary | ICD-10-CM

## 2018-01-29 DIAGNOSIS — M62830 Muscle spasm of back: Secondary | ICD-10-CM | POA: Diagnosis not present

## 2018-01-29 DIAGNOSIS — M25651 Stiffness of right hip, not elsewhere classified: Secondary | ICD-10-CM

## 2018-01-29 DIAGNOSIS — R293 Abnormal posture: Secondary | ICD-10-CM

## 2018-01-29 DIAGNOSIS — M5442 Lumbago with sciatica, left side: Secondary | ICD-10-CM | POA: Diagnosis not present

## 2018-01-29 NOTE — Therapy (Signed)
[place], [place], [address] Phone: [phone]   Fax:  [phone]  Physical Therapy Treatment  Patient Details  Name: Helen Walker MRN: [id number] Date of Birth: 1965-08-11 Referring Provider: Chana Bode, PA   Encounter Date: 01/29/2018  PT End of Session - 01/29/18 0750    Visit Number  5    Number of Visits  12    Date for PT Re-Evaluation  02/19/18    Authorization Type  BCBS    PT Start Time  0725    PT Stop Time  0758    PT Time Calculation (min)  33 min       Past Medical History:  Diagnosis Date  . Bladder mass 2013  . Medical history non-contributory     Past Surgical History:  Procedure Laterality Date  . APPENDECTOMY N/A 01/23/2015   Procedure: APPENDECTOMY;  Surgeon: Terrance Mass, MD;  Location: [facility];  Service: Gynecology;  Laterality: N/A;  . [place]  . CYSTOSCOPY  07/11/2011   Procedure: CYSTOSCOPY;  Surgeon: Hanley Ben, MD;  Location: [facility];  Service: Urology;  Laterality: N/A;  TUR Bladder Mass  . CYSTOSCOPY W/ RETROGRADES  07/11/2011   Procedure: CYSTOSCOPY WITH RETROGRADE PYELOGRAM;  Surgeon: Hanley Ben, MD;  Location: [facility] [place];  Service: Urology;  Laterality: Bilateral;  retrograde of bladder  . HYSTEROSCOPY W/ ENDOMETRIAL ABLATION  07-17-2006  . LAPAROSCOPIC ASSISTED VAGINAL HYSTERECTOMY N/A 01/23/2015   Procedure:  total abdominal hysterectomy , right salpingectomy oophorectomy;  Surgeon: Terrance Mass, MD;  Location: [facility];  Service: Gynecology;  Laterality: N/A;  request to follow first case  requests 2 1/2 hours OR time.  needs cystoscope and harmonic scalpel  . LAPAROSCOPIC LYSIS OF ADHESIONS N/A 01/23/2015   Procedure: LAPAROSCOPIC LYSIS OF ADHESIONS;  Surgeon: Terrance Mass, MD;  Location: [facility];  Service: Gynecology;  Laterality: N/A;  . TRANSURETHRAL RESECTION OF BLADDER TUMOR   07/11/2011   Procedure: TRANSURETHRAL RESECTION OF BLADDER TUMOR (TURBT);  Surgeon: Hanley Ben, MD;  Location: [facility];  Service: Urology;  Laterality: N/A;  . TUBAL LIGATION  1995    There were no vitals filed for this visit.  Subjective Assessment - 01/29/18 0728    Subjective  Still without pain. Recumbent bike is much easier now and does not cause pain.     Currently in Pain?  No/denies                       [facility] Adult PT Treatment/Exercise - 01/29/18 0001      Lumbar Exercises: Stretches   Hip Flexor Stretch  Right;Left;3 reps;30 seconds    Figure 4 Stretch  30 seconds    Other Lumbar Stretch Exercise  childs pose x 30 then to RT and Lt x 30 sec cued for alighnment.       Lumbar Exercises: Aerobic   Recumbent Bike  L3 5 min      Lumbar Exercises: Machines for Strengthening   Leg Press  40# leg press      Lumbar Exercises: Seated   Sit to Stand  10 reps with out UE       Lumbar Exercises: Supine   Pelvic Tilt  15 reps    Clam  10 reps;20 reps with PPT    Clam Limitations  single leg control, double leg, then double with green band  Bent Knee Raise  10 reps with PPT    Bent Knee Raise Limitations   table top and one foot lowers at a time.    Other Supine Lumbar Exercises  Spent much of time  with control of lower abdominals and pelvic floor and adding breathing  with movements.       Lumbar Exercises: Quadruped   Madcat/Old Horse  10 reps    Straight Leg Raise  10 reps cues for neutral and abdominal brace     Opposite Arm/Leg Raise  10 reps cues for neutral and absominal brace                PT Short Term Goals - 01/28/18 0730      PT SHORT TERM GOAL #1   Title  She will be independent with  HEP    Period  Weeks    Status  Achieved      PT SHORT TERM GOAL #2   Title  she will report no stiffness in AM getting out of bed    Baseline  no pain today getting up    Time  3    Period  Weeks    Status  Partially Met       PT SHORT TERM GOAL #3   Title  she will report overall pain decr 30% or more    Time  3    Period  Weeks    Status  Achieved      PT SHORT TERM GOAL #4   Title  She will report benefit with decr pain with heel lift    Time  3    Period  Weeks    Status  Achieved        PT Long Term Goals - 01/11/18 1645      PT LONG TERM GOAL #1   Title  She will be independent with all hEP issued    Time  6    Period  Weeks    Status  New      PT LONG TERM GOAL #2   Title  She will report pain decreased 74% or more with all normal activity.      Time  6    Period  Weeks    Status  New      PT LONG TERM GOAL #3   Title  FOTO score will improve to  32 %  limited  to demo improved function per pt    Time  6    Period  Weeks    Status  New      PT LONG TERM GOAL #4   Title  She will return to exerciseing without pain    Time  6    Period  Weeks    Status  New            Plan - 01/29/18 0750    Clinical Impression Statement  Pt reports no pain again this morning. No soreness. Continued with core and LE strength. Began sit-stands. No increased pain after treatment.     PT Next Visit Plan  Review HEp and add as needed , Manual for STW , mobs/stretch,  modalities as needed, basic core strength    PT Home Exercise Plan  Childs pose, thomas , cat camel,  transverse ab and pelvic flor exercises        Patient will benefit from skilled therapeutic intervention in order to improve the following deficits and impairments:  Pain, Decreased  range of motion, Postural dysfunction, Increased muscle spasms, Decreased activity tolerance  Visit Diagnosis: Chronic right-sided low back pain with left-sided sciatica  Abnormal posture  Muscle spasm of back  Stiffness of right hip, not elsewhere classified  Stiffness of left hip, not elsewhere classified     Problem List Patient Active Problem List   Diagnosis Date Noted  . Prediabetes 10/25/2015  . Encounter for postoperative  care 01/23/2015  . Genital herpes 11/25/2012    Dorene Ar, PTA 01/29/2018, 7:57 AM  [facility] [address] [place], [place], [address] Phone: [phone]   Fax:  [phone]  Name: Helen Walker MRN: [id number] Date of Birth: February 24, 1966

## 2018-02-02 ENCOUNTER — Ambulatory Visit: Payer: BLUE CROSS/BLUE SHIELD | Admitting: Physical Therapy

## 2018-02-02 ENCOUNTER — Encounter: Payer: Self-pay | Admitting: Physical Therapy

## 2018-02-02 DIAGNOSIS — R293 Abnormal posture: Secondary | ICD-10-CM | POA: Diagnosis not present

## 2018-02-02 DIAGNOSIS — G8929 Other chronic pain: Secondary | ICD-10-CM | POA: Diagnosis not present

## 2018-02-02 DIAGNOSIS — M62830 Muscle spasm of back: Secondary | ICD-10-CM | POA: Diagnosis not present

## 2018-02-02 DIAGNOSIS — M25651 Stiffness of right hip, not elsewhere classified: Secondary | ICD-10-CM

## 2018-02-02 DIAGNOSIS — M25652 Stiffness of left hip, not elsewhere classified: Secondary | ICD-10-CM

## 2018-02-02 DIAGNOSIS — M5442 Lumbago with sciatica, left side: Principal | ICD-10-CM

## 2018-02-02 NOTE — Patient Instructions (Addendum)
FUNCTIONAL MOBILITY: Squat    Stance: shoulder-width on floor. Bend hips and knees. Keep back straight. Do not allow knees to bend past toes. Squeeze glutes and quads to stand. _10__ reps per set, __1_ sets per day, _4-5__ days per week  Copyright  VHI. All rights reserved.  Calf Stretch    Place one leg forward, bent, other leg behind and straight. Lean forward keeping back heel flat. Hold 30____ seconds while counting out loud. Repeat with other leg forward. Repeat __3__ times. Do _1___ sessions per day.  http://[email or web address].exer.us/478   Copyright  VHI. All rights reserved.

## 2018-02-02 NOTE — Therapy (Signed)
[place], [place], [address] Phone: [phone]   Fax:  [phone]  Physical Therapy Treatment  Patient Details  Name: Helen Walker MRN: [id number] Date of Birth: 1965/08/06 Referring Provider: Chana Bode, PA   Encounter Date: 02/02/2018  PT End of Session - 02/02/18 0800    Visit Number  6    Number of Visits  12    Date for PT Re-Evaluation  02/19/18    PT Start Time  0725    PT Stop Time  0758    PT Time Calculation (min)  33 min    Activity Tolerance  Patient tolerated treatment well    Behavior During Therapy  [facility] for tasks assessed/performed       Past Medical History:  Diagnosis Date  . Bladder mass 2013  . Medical history non-contributory     Past Surgical History:  Procedure Laterality Date  . APPENDECTOMY N/A 01/23/2015   Procedure: APPENDECTOMY;  Surgeon: Terrance Mass, MD;  Location: [facility];  Service: Gynecology;  Laterality: N/A;  . [place]  . CYSTOSCOPY  07/11/2011   Procedure: CYSTOSCOPY;  Surgeon: Hanley Ben, MD;  Location: [facility];  Service: Urology;  Laterality: N/A;  TUR Bladder Mass  . CYSTOSCOPY W/ RETROGRADES  07/11/2011   Procedure: CYSTOSCOPY WITH RETROGRADE PYELOGRAM;  Surgeon: Hanley Ben, MD;  Location: [place];  Service: Urology;  Laterality: Bilateral;  retrograde of bladder  . HYSTEROSCOPY W/ ENDOMETRIAL ABLATION  07-17-2006  . LAPAROSCOPIC ASSISTED VAGINAL HYSTERECTOMY N/A 01/23/2015   Procedure:  total abdominal hysterectomy , right salpingectomy oophorectomy;  Surgeon: Terrance Mass, MD;  Location: [facility];  Service: Gynecology;  Laterality: N/A;  request to follow first case  requests 2 1/2 hours OR time.  needs cystoscope and harmonic scalpel  . LAPAROSCOPIC LYSIS OF ADHESIONS N/A 01/23/2015   Procedure: LAPAROSCOPIC LYSIS OF ADHESIONS;  Surgeon: Terrance Mass, MD;  Location: [facility];  Service: Gynecology;  Laterality: N/A;  . TRANSURETHRAL RESECTION OF BLADDER TUMOR  07/11/2011   Procedure: TRANSURETHRAL RESECTION OF BLADDER TUMOR (TURBT);  Surgeon: Hanley Ben, MD;  Location: [facility];  Service: Urology;  Laterality: N/A;  . TUBAL LIGATION  1995    There were no vitals filed for this visit.  Subjective Assessment - 02/02/18 0732    Subjective  No back pain.  Haslow back pressure soreness from medicatrion may have a bladder infection.   Hea been doing the exercises  Woke up feeling good.      Currently in Pain?  -- 5-6/10 back bladder pain,  no real back pain    Pain Score  0-No pain see above    Pain Location  Back    Pain Orientation  Lower    Pain Type  Chronic pain                       [facility] Adult PT Treatment/Exercise - 02/02/18 0001      Lumbar Exercises: Stretches   Hip Flexor Stretch  3 reps;30 seconds;Right;Left right tighter    Quad Stretch  3 reps;30 seconds;Right      Lumbar Exercises: Aerobic   Recumbent Bike  L1  5 minutes      Lumbar Exercises: Standing   Heel Raises  10 reps single leg,  both    Functional Squats  10 reps cued initially  HEP  Wall Slides  10 reps cued      Lumbar Exercises: Supine   Ab Set  -- TrA  10 C cued    Pelvic Tilt  10 reps    Clam  10 reps with TrA activation    Clam Limitations  Both             PT Education - 02/02/18 0740    Education Details  HEP    Person(s) Educated  Patient    Methods  Explanation;Demonstration;Verbal cues;Handout    Comprehension  Verbalized understanding;Returned demonstration       PT Short Term Goals - 01/28/18 0730      PT SHORT TERM GOAL #1   Title  She will be independent with  HEP    Period  Weeks    Status  Achieved      PT SHORT TERM GOAL #2   Title  she will report no stiffness in AM getting out of bed    Baseline  no pain today getting up    Time  3    Period  Weeks    Status  Partially Met      PT SHORT  TERM GOAL #3   Title  she will report overall pain decr 30% or more    Time  3    Period  Weeks    Status  Achieved      PT SHORT TERM GOAL #4   Title  She will report benefit with decr pain with heel lift    Time  3    Period  Weeks    Status  Achieved        PT Long Term Goals - 01/11/18 1645      PT LONG TERM GOAL #1   Title  She will be independent with all hEP issued    Time  6    Period  Weeks    Status  New      PT LONG TERM GOAL #2   Title  She will report pain decreased 74% or more with all normal activity.      Time  6    Period  Weeks    Status  New      PT LONG TERM GOAL #3   Title  FOTO score will improve to  32 %  limited  to demo improved function per pt    Time  6    Period  Weeks    Status  New      PT LONG TERM GOAL #4   Title  She will return to exerciseing without pain    Time  6    Period  Weeks    Status  New            Plan - 02/02/18 0800    Clinical Impression Statement  No pain except back/ bladder pain (see's MD tomorrow)  Able to progress HEP without pain.  Progress toward pain goals.      PT Next Visit Plan  Review HEp and add as needed , Manual for STW , mobs/stretch,  modalities as needed, basic core strength    PT Home Exercise Plan  Childs pose, thomas , cat camel,  transverse ab and pelvic flor exercises calf stretch,  functional squat    Consulted and Agree with Plan of Care  Patient       Patient will benefit from skilled therapeutic intervention in order to improve the following deficits and impairments:  Visit Diagnosis: Chronic right-sided low back pain with left-sided sciatica  Abnormal posture  Muscle spasm of back  Stiffness of right hip, not elsewhere classified  Stiffness of left hip, not elsewhere classified     Problem List Patient Active Problem List   Diagnosis Date Noted  . Prediabetes 10/25/2015  . Encounter for postoperative care 01/23/2015  . Genital herpes 11/25/2012    [place]   PTA 02/02/2018, 8:02 AM  [facility] [address] [place], [place], [address] Phone: [phone]   Fax:  [phone]  Name: Helen Walker MRN: [id number] Date of Birth: [birth year]

## 2018-02-03 ENCOUNTER — Ambulatory Visit (INDEPENDENT_AMBULATORY_CARE_PROVIDER_SITE_OTHER): Payer: BLUE CROSS/BLUE SHIELD | Admitting: Family Medicine

## 2018-02-03 ENCOUNTER — Encounter: Payer: Self-pay | Admitting: Family Medicine

## 2018-02-03 VITALS — BP 110/72 | HR 91 | Temp 98.2°F | Wt 165.2 lb

## 2018-02-03 DIAGNOSIS — R1030 Lower abdominal pain, unspecified: Secondary | ICD-10-CM

## 2018-02-03 DIAGNOSIS — R8290 Unspecified abnormal findings in urine: Secondary | ICD-10-CM

## 2018-02-03 LAB — COMPREHENSIVE METABOLIC PANEL
ALT: 11 IU/L (ref 0–32)
AST: 12 IU/L (ref 0–40)
Albumin/Globulin Ratio: 1.3 (ref 1.2–2.2)
Albumin: 4.3 g/dL (ref 3.5–5.5)
Alkaline Phosphatase: 57 IU/L (ref 39–117)
BUN/Creatinine Ratio: 18 (ref 9–23)
BUN: 16 mg/dL (ref 6–24)
Bilirubin Total: 0.5 mg/dL (ref 0.0–1.2)
CO2: 22 mmol/L (ref 20–29)
Calcium: 9.5 mg/dL (ref 8.7–10.2)
Chloride: 103 mmol/L (ref 96–106)
Creatinine, Ser: 0.88 mg/dL (ref 0.57–1.00)
GFR calc Af Amer: 87 mL/min/{1.73_m2} (ref >59)
GFR calc non Af Amer: 76 mL/min/{1.73_m2} (ref >59)
Globulin, Total: 3.2 g/dL (ref 1.5–4.5)
Glucose: 102 mg/dL — ABNORMAL HIGH (ref 65–99)
Potassium: 4.5 mmol/L (ref 3.5–5.2)
Sodium: 141 mmol/L (ref 134–144)
Total Protein: 7.5 g/dL (ref 6.0–8.5)

## 2018-02-03 LAB — CBC WITH DIFFERENTIAL/PLATELET
Basophils Absolute: 0 10*3/uL (ref 0.0–0.2)
Basos: 0 %
EOS (ABSOLUTE): 0.1 10*3/uL (ref 0.0–0.4)
Eos: 1 %
Hematocrit: 38 % (ref 34.0–46.6)
Hemoglobin: 13.2 g/dL (ref 11.1–15.9)
Immature Grans (Abs): 0 10*3/uL (ref 0.0–0.1)
Immature Granulocytes: 0 %
Lymphocytes Absolute: 3.1 10*3/uL (ref 0.7–3.1)
Lymphs: 39 %
MCH: 29.9 pg (ref 26.6–33.0)
MCHC: 34.7 g/dL (ref 31.5–35.7)
MCV: 86 fL (ref 79–97)
Monocytes Absolute: 0.4 10*3/uL (ref 0.1–0.9)
Monocytes: 5 %
Neutrophils Absolute: 4.4 10*3/uL (ref 1.4–7.0)
Neutrophils: 55 %
Platelets: 322 10*3/uL (ref 150–450)
RBC: 4.42 x10E6/uL (ref 3.77–5.28)
RDW: 13.6 % (ref 12.3–15.4)
WBC: 8.1 10*3/uL (ref 3.4–10.8)

## 2018-02-03 LAB — POCT URINALYSIS DIP (PROADVANTAGE DEVICE)
Bilirubin, UA: NEGATIVE
Blood, UA: NEGATIVE
Glucose, UA: NEGATIVE mg/dL
Ketones, POC UA: NEGATIVE mg/dL
Leukocytes, UA: NEGATIVE
Nitrite, UA: NEGATIVE
Protein Ur, POC: NEGATIVE mg/dL
Specific Gravity, Urine: 1.02
Urobilinogen, Ur: NEGATIVE
pH, UA: 6 (ref 5.0–8.0)

## 2018-02-03 MED ORDER — SULFAMETHOXAZOLE-TRIMETHOPRIM 800-160 MG PO TABS: 1.0000 | ORAL_TABLET | Freq: Two times a day (BID) | ORAL | 0 refills | Status: DC

## 2018-02-03 NOTE — Patient Instructions (Signed)
Take the antibiotic as prescribed for 5 days. Increase water intake.  I will send your urine for a culture and call you with results.

## 2018-02-03 NOTE — Progress Notes (Signed)
Chief Complaint  Patient presents with  . urine issues    pressure- last 3 days. urine odor for a week.     Subjective:  Helen Walker is a 52 y.o. female who complains of possible urinary tract infection.  She has had symptoms for 2 weeksSymptoms include lower abdominal "pressure" and bad odor of urine. . Patient denies fever, chills, N/V/D or constipation. no vaginal discharge.  Last UTI was over a year ago.   Using naproxen for current symptoms.    Has seen a urologist in the past for similar symptoms including malodorous urine.   Hysterectomy in past.   She is getting PT for low back pain and this is improving.   Patient does have a history of recurrent UTI. Patient does not have a history of pyelonephritis.  No other aggravating or relieving factors.  No other c/o.  Past Medical History:  Diagnosis Date  . Bladder mass 2013  . Medical history non-contributory     ROS as in subjective  Reviewed allergies, medications, past medical, surgical, and social history.    Objective: Vitals:   02/03/18 0816  BP: 110/72  Pulse: 91  Temp: 98.2 F (36.8 C)  SpO2: 98%    General appearance: alert, no distress, WD/WN, female Abdomen: +bs, soft, non tender, non distended, no masses, no hepatomegaly, no splenomegaly, no bruits Back: no CVA tenderness GU: deferred     Laboratory:  Urine dipstick: negative for all components.   Sent urine for culture per patient request     Assessment: Malodorous urine - Plan: Urine Culture, DISCONTINUED: sulfamethoxazole-trimethoprim (BACTRIM DS,SEPTRA DS) 800-160 MG tablet  Bad odor of urine - Plan: POCT Urinalysis DIP (Proadvantage Device)  Lower abdominal pain - Plan: CBC with Differential/Platelet, Comprehensive metabolic panel, Urine Culture, DISCONTINUED: sulfamethoxazole-trimethoprim (BACTRIM DS,SEPTRA DS) 800-160 MG tablet    Plan: Discussed that her urinalysis is negative and her abdominal exam is normal.  She would like to  be treated for UTI. I will send for culture. Treat with Bactrim x 5 days. If urine culture is negative and symptoms do not resolve with treatment, I will refer her back to urology since she has been seen there in the past for similar symptoms.  Check basic labs today since none recently. She is aware that she should return for a fasting CPE.   Call or return if worse or not improving.

## 2018-02-04 ENCOUNTER — Ambulatory Visit: Payer: BLUE CROSS/BLUE SHIELD

## 2018-02-04 DIAGNOSIS — M25651 Stiffness of right hip, not elsewhere classified: Secondary | ICD-10-CM | POA: Diagnosis not present

## 2018-02-04 DIAGNOSIS — M5442 Lumbago with sciatica, left side: Secondary | ICD-10-CM | POA: Diagnosis not present

## 2018-02-04 DIAGNOSIS — R293 Abnormal posture: Secondary | ICD-10-CM | POA: Diagnosis not present

## 2018-02-04 DIAGNOSIS — G8929 Other chronic pain: Secondary | ICD-10-CM | POA: Diagnosis not present

## 2018-02-04 DIAGNOSIS — M62830 Muscle spasm of back: Secondary | ICD-10-CM | POA: Diagnosis not present

## 2018-02-04 DIAGNOSIS — M25652 Stiffness of left hip, not elsewhere classified: Secondary | ICD-10-CM

## 2018-02-04 NOTE — Patient Instructions (Signed)
Pilates APPI booklet  issued    Level 1 double leg stretch ,   Level 3       ,   Level      5-10 reps 1x/day

## 2018-02-04 NOTE — Therapy (Signed)
[place], [place], [address] Phone: [phone]   Fax:  [phone]  Physical Therapy Treatment  Patient Details  Name: Helen Walker MRN: [id number] Date of Birth: 05-18-1966 Referring Provider: Chana Bode, PA   Encounter Date: 02/04/2018  PT End of Session - 02/04/18 0712    Visit Number  7    Number of Visits  12    Date for PT Re-Evaluation  02/19/18    Authorization Type  BCBS    PT Start Time  0710    PT Stop Time  0750    PT Time Calculation (min)  40 min    Activity Tolerance  Patient tolerated treatment well    Behavior During Therapy  [facility] for tasks assessed/performed       Past Medical History:  Diagnosis Date  . Bladder mass 2013  . Medical history non-contributory     Past Surgical History:  Procedure Laterality Date  . APPENDECTOMY N/A 01/23/2015   Procedure: APPENDECTOMY;  Surgeon: Terrance Mass, MD;  Location: [facility];  Service: Gynecology;  Laterality: N/A;  . [place]  . CYSTOSCOPY  07/11/2011   Procedure: CYSTOSCOPY;  Surgeon: Hanley Ben, MD;  Location: [facility];  Service: Urology;  Laterality: N/A;  TUR Bladder Mass  . CYSTOSCOPY W/ RETROGRADES  07/11/2011   Procedure: CYSTOSCOPY WITH RETROGRADE PYELOGRAM;  Surgeon: Hanley Ben, MD;  Location: [place];  Service: Urology;  Laterality: Bilateral;  retrograde of bladder  . HYSTEROSCOPY W/ ENDOMETRIAL ABLATION  07-17-2006  . LAPAROSCOPIC ASSISTED VAGINAL HYSTERECTOMY N/A 01/23/2015   Procedure:  total abdominal hysterectomy , right salpingectomy oophorectomy;  Surgeon: Terrance Mass, MD;  Location: [facility];  Service: Gynecology;  Laterality: N/A;  request to follow first case  requests 2 1/2 hours OR time.  needs cystoscope and harmonic scalpel  . LAPAROSCOPIC LYSIS OF ADHESIONS N/A 01/23/2015   Procedure: LAPAROSCOPIC LYSIS OF ADHESIONS;  Surgeon: Terrance Mass, MD;  Location: [facility];  Service: Gynecology;  Laterality: N/A;  . TRANSURETHRAL RESECTION OF BLADDER TUMOR  07/11/2011   Procedure: TRANSURETHRAL RESECTION OF BLADDER TUMOR (TURBT);  Surgeon: Hanley Ben, MD;  Location: [facility];  Service: Urology;  Laterality: N/A;  . TUBAL LIGATION  1995    There were no vitals filed for this visit.  Subjective Assessment - 02/04/18 0712    Subjective  no back pain today    Currently in Pain?  No/denies                       [facility] Adult PT Treatment/Exercise - 02/04/18 0001      Lumbar Exercises: Supine   Pelvic Tilt  10 reps    Clam  10 reps    Clam Limitations  single leg in table top.     Bent Knee Raise  10 reps    Bent Knee Raise Limitations   table top and one foot lowers at a time.      Lumbar Exercises: Quadruped   Madcat/Old Horse  10 reps    Opposite Arm/Leg Raise  10 reps;Right arm/Left leg;Left arm/Right leg             PT Education - 02/04/18 0815    Education Details  HEP     Person(s) Educated  Patient    Methods  Explanation;Tactile cues;Verbal cues;Handout;Demonstration    Comprehension  Verbalized  understanding;Returned demonstration                 Plan - 02/04/18 0713    Clinical Impression Statement  Dong well with stab exercises without pain . Cued to control ROM and reps depending on pain and control.    Probable discharge next week.     PT Treatment/Interventions  Dry needling;Patient/family education;Therapeutic exercise;Therapeutic activities;Moist Heat;Ultrasound;Manual techniques;Passive range of motion    PT Home Exercise Plan  Childs pose, thomas , cat camel,  transverse ab and pelvic flor exercises calf stretch,  functional squat, Pilates double leg stretch L1.  hip twist L4  Scissors Level 2     Consulted and Agree with Plan of Care  Patient       Patient will benefit from skilled therapeutic intervention in order to improve the following  deficits and impairments:  Pain, Decreased range of motion, Postural dysfunction, Increased muscle spasms, Decreased activity tolerance  Visit Diagnosis: Chronic right-sided low back pain with left-sided sciatica  Abnormal posture  Muscle spasm of back  Stiffness of right hip, not elsewhere classified  Stiffness of left hip, not elsewhere classified     Problem List Patient Active Problem List   Diagnosis Date Noted  . Prediabetes 10/25/2015  . Encounter for postoperative care 01/23/2015  . Genital herpes 11/25/2012    Helen Walker  PT 02/04/2018, 8:37 AM  [facility] [address] [place], [place], [address] Phone: [phone]   Fax:  [phone]  Name: Helen Walker MRN: [id number] Date of Birth: 1966-01-07

## 2018-02-05 LAB — URINE CULTURE

## 2018-02-09 ENCOUNTER — Ambulatory Visit: Payer: BLUE CROSS/BLUE SHIELD

## 2018-02-09 DIAGNOSIS — M25651 Stiffness of right hip, not elsewhere classified: Secondary | ICD-10-CM

## 2018-02-09 DIAGNOSIS — R293 Abnormal posture: Secondary | ICD-10-CM | POA: Diagnosis not present

## 2018-02-09 DIAGNOSIS — M5442 Lumbago with sciatica, left side: Principal | ICD-10-CM

## 2018-02-09 DIAGNOSIS — G8929 Other chronic pain: Secondary | ICD-10-CM

## 2018-02-09 DIAGNOSIS — M62830 Muscle spasm of back: Secondary | ICD-10-CM | POA: Diagnosis not present

## 2018-02-09 DIAGNOSIS — M25652 Stiffness of left hip, not elsewhere classified: Secondary | ICD-10-CM

## 2018-02-09 NOTE — Therapy (Signed)
[place], [place], [address] Phone: [phone]   Fax:  [phone]  Physical Therapy Treatment  Patient Details  Name: Helen Walker MRN: [id number] Date of Birth: 03-10-1966 Referring Provider: Chana Bode, PA   Encounter Date: 02/09/2018  PT End of Session - 02/09/18 0742    Visit Number  8    Number of Visits  12    Date for PT Re-Evaluation  02/19/18    Authorization Type  BCBS    PT Start Time  [phone]    PT Stop Time  0732    PT Time Calculation (min)  40 min    Activity Tolerance  Patient tolerated treatment well    Behavior During Therapy  [facility] for tasks assessed/performed       Past Medical History:  Diagnosis Date  . Bladder mass 2013  . Medical history non-contributory     Past Surgical History:  Procedure Laterality Date  . APPENDECTOMY N/A 01/23/2015   Procedure: APPENDECTOMY;  Surgeon: Terrance Mass, MD;  Location: [facility];  Service: Gynecology;  Laterality: N/A;  . [place]  . CYSTOSCOPY  07/11/2011   Procedure: CYSTOSCOPY;  Surgeon: Hanley Ben, MD;  Location: [facility];  Service: Urology;  Laterality: N/A;  TUR Bladder Mass  . CYSTOSCOPY W/ RETROGRADES  07/11/2011   Procedure: CYSTOSCOPY WITH RETROGRADE PYELOGRAM;  Surgeon: Hanley Ben, MD;  Location: [place];  Service: Urology;  Laterality: Bilateral;  retrograde of bladder  . HYSTEROSCOPY W/ ENDOMETRIAL ABLATION  07-17-2006  . LAPAROSCOPIC ASSISTED VAGINAL HYSTERECTOMY N/A 01/23/2015   Procedure:  total abdominal hysterectomy , right salpingectomy oophorectomy;  Surgeon: Terrance Mass, MD;  Location: [facility];  Service: Gynecology;  Laterality: N/A;  request to follow first case  requests 2 1/2 hours OR time.  needs cystoscope and harmonic scalpel  . LAPAROSCOPIC LYSIS OF ADHESIONS N/A 01/23/2015   Procedure: LAPAROSCOPIC LYSIS OF ADHESIONS;  Surgeon: Terrance Mass, MD;  Location: [facility];  Service: Gynecology;  Laterality: N/A;  . TRANSURETHRAL RESECTION OF BLADDER TUMOR  07/11/2011   Procedure: TRANSURETHRAL RESECTION OF BLADDER TUMOR (TURBT);  Surgeon: Hanley Ben, MD;  Location: [facility];  Service: Urology;  Laterality: N/A;  . TUBAL LIGATION  1995    There were no vitals filed for this visit.  Subjective Assessment - 02/09/18 0745    Subjective  No pain and no pain for a few days . Doing better. Feel good    Currently in Pain?  No/denies                       [facility] Adult PT Treatment/Exercise - 02/09/18 0001      Lumbar Exercises: Aerobic   Recumbent Bike  L2  5 minutes      Lumbar Exercises: Machines for Strengthening   Other Lumbar Machine Exercise  pull downs / rows      Lumbar Exercises: Standing   Heel Raises  10 reps    Functional Squats  10 reps   dead lift   Shoulder Extension  15 reps    Other Standing Lumbar Exercises  green bands hor movement stable core and arm movements with band       Lumbar Exercises: Quadruped   Madcat/Old Horse  10 reps    Opposite Arm/Leg Raise  10 reps;Right arm/Left leg;Left arm/Right leg   with red band  PT Education - 02/09/18 0731    Education Details  Band stabilization exer    Person(s) Educated  Patient    Methods  Explanation;Demonstration;Verbal cues;Handout    Comprehension  Verbalized understanding;Returned demonstration       PT Short Term Goals - 02/09/18 0743      PT SHORT TERM GOAL #1   Title  She will be independent with  HEP    Status  Achieved      PT SHORT TERM GOAL #2   Title  she will report no stiffness in AM getting out of bed    Status  Achieved      PT SHORT TERM GOAL #3   Title  she will report overall pain decr 30% or more    Status  Achieved      PT SHORT TERM GOAL #4   Title  She will report benefit with decr pain with heel lift    Status  Achieved        PT Long Term Goals - 02/09/18  0744      PT LONG TERM GOAL #1   Title  She will be independent with all hEP issued    Status  On-going      PT LONG TERM GOAL #2   Title  She will report pain decreased 74% or more with all normal activity.      Status  Achieved      PT LONG TERM GOAL #3   Title  FOTO score will improve to  32 %  limited  to demo improved function per pt    Status  Unable to assess      PT LONG TERM GOAL #4   Title  She will return to exerciseing without pain    Status  On-going            Plan - 02/09/18 0742    Clinical Impression Statement  She is doing well without pain now and able to do more challenging stabilization exer.   2-3 more sessions and probable discharge    PT Treatment/Interventions  Dry needling;Patient/family education;Therapeutic exercise;Therapeutic activities;Moist Heat;Ultrasound;Manual techniques;Passive range of motion    PT Next Visit Plan  Review HEp and add as needed , Manual for STW , mobs/stretch,  modalities as needed, basic core strength    PT Home Exercise Plan  Childs pose, thomas , cat camel,  transverse ab and pelvic flor exercises calf stretch,  functional squat, Pilates double leg stretch L1.  hip twist L4  Scissors Level 2     Consulted and Agree with Plan of Care  Patient       Patient will benefit from skilled therapeutic intervention in order to improve the following deficits and impairments:  Pain, Decreased range of motion, Postural dysfunction, Increased muscle spasms, Decreased activity tolerance  Visit Diagnosis: Chronic right-sided low back pain with left-sided sciatica  Abnormal posture  Muscle spasm of back  Stiffness of right hip, not elsewhere classified  Stiffness of left hip, not elsewhere classified     Problem List Patient Active Problem List   Diagnosis Date Noted  . Prediabetes 10/25/2015  . Encounter for postoperative care 01/23/2015  . Genital herpes 11/25/2012    [name] PT 02/09/2018, 7:46 AM  [facility] [address] [place], [place], [address] Phone: [phone]   Fax:  [phone]  Name: Helen Walker MRN: [id number] Date of Birth: 16-Aug-1965

## 2018-02-09 NOTE — Patient Instructions (Signed)
Rotation: With Tubing (Standing)    Left side to anchor, hold tubing with both arms across chest.. Repeat _10__ times. Repeat from other side for set. Rest ___ seconds after set. Do __1-2_ sets per session. DON'T TURN BODY  http://plyo.exer.us/158   Copyright  VHI. All rights reserved.  Hip Extension: Hands and Knees (Single Leg)    On all-fours, anchor tubing around thigh and around bottom of other foot. Extend that foot back. Avoid arching lower back. Repeat _10_ times per set. Repeat with other leg. Do _1_ sets per session. Do __ sessions per week.      CAN GRIP BAND AND DO ARM AND LEG  http://tub.exer.us/42   Copyright  VHI. All rights reserved.  Trunk: Hip Chop    Hold band at one hip.  move band  from hip to above opposite shoulder and  return. Repeat _RT/Lt   10__ times. Repeat from other side for set. Rest ___ seconds after set. Do _1__ sets per session. NOTE: Can allow hips to rotate a little  http://plyo.exer.us/43   Copyright  VHI. All rights reserved.

## 2018-02-11 ENCOUNTER — Ambulatory Visit: Payer: BLUE CROSS/BLUE SHIELD

## 2018-02-11 DIAGNOSIS — M62830 Muscle spasm of back: Secondary | ICD-10-CM

## 2018-02-11 DIAGNOSIS — R293 Abnormal posture: Secondary | ICD-10-CM

## 2018-02-11 DIAGNOSIS — M25651 Stiffness of right hip, not elsewhere classified: Secondary | ICD-10-CM | POA: Diagnosis not present

## 2018-02-11 DIAGNOSIS — M5442 Lumbago with sciatica, left side: Secondary | ICD-10-CM | POA: Diagnosis not present

## 2018-02-11 DIAGNOSIS — M25652 Stiffness of left hip, not elsewhere classified: Secondary | ICD-10-CM | POA: Diagnosis not present

## 2018-02-11 DIAGNOSIS — G8929 Other chronic pain: Secondary | ICD-10-CM | POA: Diagnosis not present

## 2018-02-11 NOTE — Therapy (Signed)
[place], [place], [address] Phone: [phone]   Fax:  [phone]  Physical Therapy Treatment/Discharge  Patient Details  Name: Helen Walker MRN: [id number] Date of Birth: 12/31/1965 Referring Provider: Chana Bode, PA   Encounter Date: 02/11/2018  PT End of Session - 02/11/18 0702    Visit Number  9    Number of Visits  12    Date for PT Re-Evaluation  02/19/18    Authorization Type  [employer]    PT Start Time  0700    PT Stop Time  0745    PT Time Calculation (min)  45 min    Activity Tolerance  Patient tolerated treatment well    Behavior During Therapy  [facility] for tasks assessed/performed       Past Medical History:  Diagnosis Date  . Bladder mass 2013  . Medical history non-contributory     Past Surgical History:  Procedure Laterality Date  . APPENDECTOMY N/A 01/23/2015   Procedure: APPENDECTOMY;  Surgeon: Terrance Mass, MD;  Location: [facility];  Service: Gynecology;  Laterality: N/A;  . [place]  . CYSTOSCOPY  07/11/2011   Procedure: CYSTOSCOPY;  Surgeon: Hanley Ben, MD;  Location: [facility];  Service: Urology;  Laterality: N/A;  TUR Bladder Mass  . CYSTOSCOPY W/ RETROGRADES  07/11/2011   Procedure: CYSTOSCOPY WITH RETROGRADE PYELOGRAM;  Surgeon: Hanley Ben, MD;  Location: [facility];  Service: Urology;  Laterality: Bilateral;  retrograde of bladder  . HYSTEROSCOPY W/ ENDOMETRIAL ABLATION  07-17-2006  . LAPAROSCOPIC ASSISTED VAGINAL HYSTERECTOMY N/A 01/23/2015   Procedure:  total abdominal hysterectomy , right salpingectomy oophorectomy;  Surgeon: Terrance Mass, MD;  Location: [facility];  Service: Gynecology;  Laterality: N/A;  request to follow first case  requests 2 1/2 hours OR time.  needs cystoscope and harmonic scalpel  . LAPAROSCOPIC LYSIS OF ADHESIONS N/A 01/23/2015   Procedure: LAPAROSCOPIC LYSIS OF ADHESIONS;   Surgeon: Terrance Mass, MD;  Location: [facility];  Service: Gynecology;  Laterality: N/A;  . TRANSURETHRAL RESECTION OF BLADDER TUMOR  07/11/2011   Procedure: TRANSURETHRAL RESECTION OF BLADDER TUMOR (TURBT);  Surgeon: Hanley Ben, MD;  Location: [facility];  Service: Urology;  Laterality: N/A;  . TUBAL LIGATION  1995    There were no vitals filed for this visit.  Subjective Assessment - 02/11/18 0706    Subjective  Feels wonderful now. Ready for discharge.    Currently in Pain?  No/denies                       [facility] Adult PT Treatment/Exercise - 02/11/18 0001      Lumbar Exercises: Stretches   Other Lumbar Stretch Exercise  prayer stretch       Lumbar Exercises: Aerobic   Recumbent Bike  L2  5 minutes      Lumbar Exercises: Standing   Shoulder Extension  15 reps    Other Standing Lumbar Exercises  green bands hor movement stable core and arm movements with band with cueing for inhale exhale core facilitation with diaphram and abs.       Lumbar Exercises: Supine   Pelvic Tilt  --   with exhale on contraction   Clam Limitations  single leg in table top.     Bent Knee Raise  10 reps    Bent Knee Raise Limitations   table top and  one foot lowers at a time. with inhale /exhale      Lumbar Exercises: Quadruped   Madcat/Old Horse  10 reps    Opposite Arm/Leg Raise  10 reps;Right arm/Left leg;Left arm/Right leg   cued for breathing            PT Education - 02/11/18 0740    Education Details  Reviewed  HEP and added emphasis on breathing    Person(s) Educated  Patient    Methods  Explanation;Demonstration;Verbal cues    Comprehension  Returned demonstration;Verbalized understanding       PT Short Term Goals - 02/09/18 0743      PT SHORT TERM GOAL #1   Title  She will be independent with  HEP    Status  Achieved      PT SHORT TERM GOAL #2   Title  she will report no stiffness in AM getting out of bed    Status  Achieved      PT  SHORT TERM GOAL #3   Title  she will report overall pain decr 30% or more    Status  Achieved      PT SHORT TERM GOAL #4   Title  She will report benefit with decr pain with heel lift    Status  Achieved        PT Long Term Goals - 02/11/18 0741      PT LONG TERM GOAL #1   Title  She will be independent with all hEP issued    Status  Achieved      PT LONG TERM GOAL #2   Title  She will report pain decreased 74% or more with all normal activity.      Status  Achieved      PT LONG TERM GOAL #3   Title  FOTO score will improve to  32 %  limited  to demo improved function per pt    Baseline  31% at disharge    Status  Achieved      PT LONG TERM GOAL #4   Title  She will return to exerciseing without pain    Status  Achieved            Plan - 02/11/18 0704    Clinical Impression Statement  Continues to do well and reports ready for discharge and will do so today with HEP    PT Treatment/Interventions  Dry needling;Patient/family education;Therapeutic exercise;Therapeutic activities;Moist Heat;Ultrasound;Manual techniques;Passive range of motion    PT Next Visit Plan  Discharge with HEp today    PT Home Exercise Plan  Childs pose, thomas , cat camel,  transverse ab and pelvic flor exercises calf stretch,  functional squat, Pilates double leg stretch L1.  hip twist L4  Scissors Level 2     Consulted and Agree with Plan of Care  Patient       Patient will benefit from skilled therapeutic intervention in order to improve the following deficits and impairments:  Pain, Decreased range of motion, Postural dysfunction, Increased muscle spasms, Decreased activity tolerance  Visit Diagnosis: Chronic right-sided low back pain with left-sided sciatica  Abnormal posture  Muscle spasm of back     Problem List Patient Active Problem List   Diagnosis Date Noted  . Prediabetes 10/25/2015  . Encounter for postoperative care 01/23/2015  . Genital herpes 11/25/2012    [name] 02/11/2018, 7:44 AM  [place], [place],  [address] Phone: [phone]   Fax:  [phone]  Name: Helen Walker MRN: [id number] Date of Birth: [birth year]  PHYSICAL THERAPY DISCHARGE SUMMARY  Visits from Start of Care: 9  Current functional level related to goals / functional outcomes: See above   Remaining deficits: See above   Education / Equipment: HEP Plan: Patient agrees to discharge.  Patient goals were met. Patient is being discharged due to meeting the stated rehab goals.  ?????

## 2018-03-02 ENCOUNTER — Encounter: Payer: Self-pay | Admitting: Family Medicine

## 2018-03-02 ENCOUNTER — Ambulatory Visit (INDEPENDENT_AMBULATORY_CARE_PROVIDER_SITE_OTHER): Payer: BLUE CROSS/BLUE SHIELD | Admitting: Family Medicine

## 2018-03-02 VITALS — BP 120/80 | HR 76 | Ht 68.0 in | Wt 161.4 lb

## 2018-03-02 DIAGNOSIS — Z1211 Encounter for screening for malignant neoplasm of colon: Secondary | ICD-10-CM | POA: Diagnosis not present

## 2018-03-02 DIAGNOSIS — Z0000 Encounter for general adult medical examination without abnormal findings: Secondary | ICD-10-CM

## 2018-03-02 DIAGNOSIS — Z87898 Personal history of other specified conditions: Secondary | ICD-10-CM | POA: Diagnosis not present

## 2018-03-02 DIAGNOSIS — Z23 Encounter for immunization: Secondary | ICD-10-CM

## 2018-03-02 DIAGNOSIS — Z13220 Encounter for screening for lipoid disorders: Secondary | ICD-10-CM

## 2018-03-02 LAB — POCT URINALYSIS DIP (PROADVANTAGE DEVICE)
Bilirubin, UA: NEGATIVE
Blood, UA: NEGATIVE
Glucose, UA: NEGATIVE mg/dL
Ketones, POC UA: NEGATIVE mg/dL
Nitrite, UA: NEGATIVE
Protein Ur, POC: NEGATIVE mg/dL
Specific Gravity, Urine: 1.025
Urobilinogen, Ur: NEGATIVE
pH, UA: 6 (ref 5.0–8.0)

## 2018-03-02 NOTE — Patient Instructions (Addendum)
Call and check with your insurance company regarding the Shingrix vaccine.   It was a pleasure to see you today. Continue taking good care of yourself.   [facility] office will call you to schedule an appointment to discuss your screening colonoscopy.   Preventative Care for Adults - Female      MAINTAIN REGULAR HEALTH EXAMS:  A routine yearly physical is a good way to check in with your primary care provider about your health and preventive screening. It is also an opportunity to share updates about your health and any concerns you have, and receive a thorough all-over exam.   Most health insurance companies pay for at least some preventative services.  Check with your health plan for specific coverages.  WHAT PREVENTATIVE SERVICES DO WOMEN NEED?  Adult women should have their weight and blood pressure checked regularly.   Women age 19 and older should have their cholesterol levels checked regularly.  Women should be screened for cervical cancer with a Pap smear and pelvic exam beginning at age 38. Breast cancer screening generally begins at age 55 with a mammogram and breast exam by your primary care provider.    Beginning at age 30 and continuing to age 63, women should be screened for colorectal cancer.  Certain people may need continued testing until age 48.  Updating vaccinations is part of preventative care.  Vaccinations help protect against diseases such as the flu.  Osteoporosis is a disease in which the bones lose minerals and strength as we age. Women ages 72 and over should discuss this with their caregivers, as should women after menopause who have other risk factors.  Lab tests are generally done as part of preventative care to screen for anemia and blood disorders, to screen for problems with the kidneys and liver, to screen for bladder problems, to check blood sugar, and to check your cholesterol level.  Preventative services generally include counseling about diet,  exercise, avoiding tobacco, drugs, excessive alcohol consumption, and sexually transmitted infections.    GENERAL RECOMMENDATIONS FOR GOOD HEALTH:  Healthy diet:  Eat a variety of foods, including fruit, vegetables, animal or vegetable protein, such as meat, fish, chicken, and eggs, or beans, lentils, tofu, and grains, such as rice.  Drink plenty of water daily.  Decrease saturated fat in the diet, avoid lots of red meat, processed foods, sweets, fast foods, and fried foods.  Exercise:  Aerobic exercise helps maintain good heart health. At least 30-40 minutes of moderate-intensity exercise is recommended. For example, a brisk walk that increases your heart rate and breathing. This should be done on most days of the week.   Find a type of exercise or a variety of exercises that you enjoy so that it becomes a part of your daily life.  Examples are running, walking, swimming, water aerobics, and biking.  For motivation and support, explore group exercise such as aerobic class, spin class, Zumba, Yoga,or  martial arts, etc.    Set exercise goals for yourself, such as a certain weight goal, walk or run in a race such as a 5k walk/run.  Speak to your primary care provider about exercise goals.  Disease prevention:  If you smoke or chew tobacco, find out from your caregiver how to quit. It can literally save your life, no matter how long you have been a tobacco user. If you do not use tobacco, never begin.   Maintain a healthy diet and normal weight. Increased weight leads to problems with blood  pressure and diabetes.   The Body Mass Index or BMI is a way of measuring how much of your body is fat. Having a BMI above 27 increases the risk of heart disease, diabetes, hypertension, stroke and other problems related to obesity. Your caregiver can help determine your BMI and based on it develop an exercise and dietary program to help you achieve or maintain this important measurement at a healthful  level.  High blood pressure causes heart and blood vessel problems.  Persistent high blood pressure should be treated with medicine if weight loss and exercise do not work.   Fat and cholesterol leaves deposits in your arteries that can block them. This causes heart disease and vessel disease elsewhere in your body.  If your cholesterol is found to be high, or if you have heart disease or certain other medical conditions, then you may need to have your cholesterol monitored frequently and be treated with medication.   Ask if you should have a cardiac stress test if your history suggests this. A stress test is a test done on a treadmill that looks for heart disease. This test can find disease prior to there being a problem.  Menopause can be associated with physical symptoms and risks. Hormone replacement therapy is available to decrease these. You should talk to your caregiver about whether starting or continuing to take hormones is right for you.   Osteoporosis is a disease in which the bones lose minerals and strength as we age. This can result in serious bone fractures. Risk of osteoporosis can be identified using a bone density scan. Women ages 18 and over should discuss this with their caregivers, as should women after menopause who have other risk factors. Ask your caregiver whether you should be taking a calcium supplement and Vitamin D, to reduce the rate of osteoporosis.   Avoid drinking alcohol in excess (more than two drinks per day).  Avoid use of street drugs. Do not share needles with anyone. Ask for professional help if you need assistance or instructions on stopping the use of alcohol, cigarettes, and/or drugs.  Brush your teeth twice a day with fluoride toothpaste, and floss once a day. Good oral hygiene prevents tooth decay and gum disease. The problems can be painful, unattractive, and can cause other health problems. Visit your dentist for a routine oral and dental check up and  preventive care every 6-12 months.   Look at your skin regularly.  Use a mirror to look at your back. Notify your caregivers of changes in moles, especially if there are changes in shapes, colors, a size larger than a pencil eraser, an irregular border, or development of new moles.  Safety:  Use seatbelts 100% of the time, whether driving or as a passenger.  Use safety devices such as hearing protection if you work in environments with loud noise or significant background noise.  Use safety glasses when doing any work that could send debris in to the eyes.  Use a helmet if you ride a bike or motorcycle.  Use appropriate safety gear for contact sports.  Talk to your caregiver about gun safety.  Use sunscreen with a SPF (or skin protection factor) of 15 or greater.  Lighter skinned people are at a greater risk of skin cancer. Don't forget to also wear sunglasses in order to protect your eyes from too much damaging sunlight. Damaging sunlight can accelerate cataract formation.   Practice safe sex. Use condoms. Condoms are used for birth control  and to help reduce the spread of sexually transmitted infections (or STIs).  Some of the STIs are gonorrhea (the clap), chlamydia, syphilis, trichomonas, herpes, HPV (human papilloma virus) and HIV (human immunodeficiency virus) which causes AIDS. The herpes, HIV and HPV are viral illnesses that have no cure. These can result in disability, cancer and death.   Keep carbon monoxide and smoke detectors in your home functioning at all times. Change the batteries every 6 months or use a model that plugs into the wall.   Vaccinations:  Stay up to date with your tetanus shots and other required immunizations. You should have a booster for tetanus every 10 years. Be sure to get your flu shot every year, since 5%-20% of the [place] population comes down with the flu. The flu vaccine changes each year, so being vaccinated once is not enough. Get your shot in the fall, before  the flu season peaks.   Other vaccines to consider:  Human Papilloma Virus or HPV causes cancer of the cervix, and other infections that can be transmitted from person to person. There is a vaccine for HPV, and females should get immunized between the ages of 80 and 63. It requires a series of 3 shots.   Pneumococcal vaccine to protect against certain types of pneumonia.  This is normally recommended for adults age 71 or older.  However, adults younger than 52 years old with certain underlying conditions such as diabetes, heart or lung disease should also receive the vaccine.  Shingles vaccine to protect against Varicella Zoster if you are older than age 55, or younger than 52 years old with certain underlying illness.  Hepatitis A vaccine to protect against a form of infection of the liver by a virus acquired from food.  Hepatitis B vaccine to protect against a form of infection of the liver by a virus acquired from blood or body fluids, particularly if you work in health care.  If you plan to travel internationally, check with your local health department for specific vaccination recommendations.  Cancer Screening:  Breast cancer screening is essential to preventive care for women. All women age 74 and older should perform a breast self-exam every month. At age 70 and older, women should have their caregiver complete a breast exam each year. Women at ages 72 and older should have a mammogram (x-ray film) of the breasts. Your caregiver can discuss how often you need mammograms.    Cervical cancer screening includes taking a Pap smear (sample of cells examined under a microscope) from the cervix (end of the uterus). It also includes testing for HPV (Human Papilloma Virus, which can cause cervical cancer). Screening and a pelvic exam should begin at age 68, or 3 years after a woman becomes sexually active. Screening should occur every year, with a Pap smear but no HPV testing, up to age 63. After  age 66, you should have a Pap smear every 3 years with HPV testing, if no HPV was found previously.   Most routine colon cancer screening begins at the age of 59. On a yearly basis, doctors may provide special easy to use take-home tests to check for hidden blood in the stool. Sigmoidoscopy or colonoscopy can detect the earliest forms of colon cancer and is life saving. These tests use a small camera at the end of a tube to directly examine the colon. Speak to your caregiver about this at age 38, when routine screening begins (and is repeated every 5 years unless early  forms of pre-cancerous polyps or small growths are found).

## 2018-03-02 NOTE — Progress Notes (Signed)
Subjective:    Patient ID: Helen Walker, female    DOB: Feb 23, 1966, 52 y.o.   MRN: [id number]  HPI Chief Complaint  Patient presents with  . fasting cpe    fasting cpe. has an obgyn- but her Dr. Retired there, had hysterectomy 2016. gets eye checked yearly   She is here for a complete physical exam.  Other providers: Dr. Einar Gip - eyes    Social history: works in [employer] service  Denies smoking, drug use, occasional alcohol use Diet: fairly healthy  Excerise: doing PT exercises for back   Immunizations: UTD  Health maintenance:  Mammogram: 12/2017 Colonoscopy: 2004  Last Gynecological Exam: 2016 Last Dental Exam: twice annually  Last Eye Exam: last year   Wears seatbelt always, smoke detectors in home and functioning, does not text while driving and feels safe in home environment.   Reviewed allergies, medications, past medical, surgical, family, and social history.   Review of Systems Review of Systems Constitutional: -fever, -chills, -sweats, -unexpected weight change,-fatigue ENT: -runny nose, -ear pain, -sore throat Cardiology:  -chest pain, -palpitations, -edema Respiratory: -cough, -shortness of breath, -wheezing Gastroenterology: -abdominal pain, -nausea, -vomiting, -diarrhea, -constipation  Hematology: -bleeding or bruising problems Musculoskeletal: -arthralgias, -myalgias, -joint swelling, -back pain Ophthalmology: -vision changes Urology: -dysuria, -difficulty urinating, -hematuria, -urinary frequency, -urgency Neurology: -headache, -weakness, -tingling, -numbness       Objective:   Physical Exam BP 120/80   Pulse 76   Ht 5\' 8"  (1.727 m)   Wt 161 lb 6.4 oz (73.2 kg)   LMP 11/16/2014 (Approximate)   BMI 24.54 kg/m   General Appearance:    Alert, cooperative, no distress, appears stated age  Head:    Normocephalic, without obvious abnormality, atraumatic  Eyes:    PERRL, conjunctiva/corneas clear, EOM's intact, fundi    benign  Ears:     Normal TM's and external ear canals  Nose:   Nares normal, mucosa normal, no drainage or sinus   tenderness  Throat:   Lips, mucosa, and tongue normal; teeth and gums normal  Neck:   Supple, no lymphadenopathy;  thyroid:  no   enlargement/tenderness/nodules; no carotid   bruit or JVD  Back:    Spine nontender, no curvature, ROM normal, no CVA     tenderness  Lungs:     Clear to auscultation bilaterally without wheezes, rales or     ronchi; respirations unlabored  Chest Wall:    No tenderness or deformity   Heart:    Regular rate and rhythm, S1 and S2 normal, no murmur, rub   or gallop  Breast Exam:    Declines.   Abdomen:     Soft, non-tender, nondistended, normoactive bowel sounds,    no masses, no hepatosplenomegaly  Genitalia:    Declines.      Extremities:   No clubbing, cyanosis or edema  Pulses:   2+ and symmetric all extremities  Skin:   Skin color, texture, turgor normal, no rashes or lesions  Lymph nodes:   Cervical, supraclavicular, and axillary nodes normal  Neurologic:   CNII-XII intact, normal strength, sensation and gait; reflexes 2+ and symmetric throughout          Psych:   Normal mood, affect, hygiene and grooming.    Urinalysis dipstick: trace leuk (asymptomatic) otherwise negative       Assessment & Plan:  Routine general medical examination at a health care facility - Plan: POCT Urinalysis DIP (Proadvantage Device), Lipid panel, TSH, T4, free  Screen for  colon cancer - Plan: Ambulatory referral to Gastroenterology  Needs flu shot - Plan: Flu Vaccine QUAD 36+ mos IM  History of prediabetes - Plan: Hemoglobin A1c  Screening for lipid disorders - Plan: Lipid panel  She is doing well physically and emotionally.  Appears to be taking good care of herself. Discussed healthy diet and exercise. History of prediabetes-check hemoglobin A1c Screen for lipid disorders Flu shot given Referral to GI for screening colonoscopy Follow-up pending labs

## 2018-03-03 LAB — LIPID PANEL
Chol/HDL Ratio: 3.8 ratio (ref 0.0–4.4)
Cholesterol, Total: 176 mg/dL (ref 100–199)
HDL: 46 mg/dL (ref >39)
LDL Calculated: 114 mg/dL — ABNORMAL HIGH (ref 0–99)
Triglycerides: 79 mg/dL (ref 0–149)
VLDL Cholesterol Cal: 16 mg/dL (ref 5–40)

## 2018-03-03 LAB — TSH: TSH: 0.847 u[IU]/mL (ref 0.450–4.500)

## 2018-03-03 LAB — HEMOGLOBIN A1C
Est. average glucose Bld gHb Est-mCnc: 120 mg/dL
Hgb A1c MFr Bld: 5.8 % — ABNORMAL HIGH (ref 4.8–5.6)

## 2018-03-03 LAB — T4, FREE: Free T4: 1.05 ng/dL (ref 0.82–1.77)

## 2018-06-21 ENCOUNTER — Ambulatory Visit (INDEPENDENT_AMBULATORY_CARE_PROVIDER_SITE_OTHER): Payer: BLUE CROSS/BLUE SHIELD | Admitting: Family Medicine

## 2018-06-21 ENCOUNTER — Encounter: Payer: Self-pay | Admitting: Family Medicine

## 2018-06-21 VITALS — BP 120/82 | HR 69 | Wt 159.4 lb

## 2018-06-21 DIAGNOSIS — R61 Generalized hyperhidrosis: Secondary | ICD-10-CM

## 2018-06-21 DIAGNOSIS — N951 Menopausal and female climacteric states: Secondary | ICD-10-CM

## 2018-06-21 NOTE — Progress Notes (Signed)
   Subjective:    Patient ID: Helen Walker, female    DOB: 1965/11/06, 52 y.o.   MRN: [id number]  HPI Chief Complaint  Patient presents with  . BP Issues.    BP issues. bp was 123/83- saturday headache,some dizzy, - took apple cidar and it took headache away and still felt goggy but has felt better since.    She is here with concerns related to BP and new onset of night sweats. States BP was 123/80. Is not aware of normal BP.  States she has been having 2-3 episodes of night sweats for the past week. Onset was sudden. No hot flashes during the day.   Reports having a hysterectomy in 2016 for benign issues. She recalls having one ovary.   Denies fever, chills, chest pain, palpitations, shortness of breath, cough, abdominal pain, N/V/D, urinary symptoms, LE edema.  No changes in bowel habits.  Denies mood swings.   Reviewed allergies, medications, past medical, surgical, family, and social history.   Review of Systems Pertinent positives and negatives in the history of present illness.     Objective:   Physical Exam BP 120/82   Pulse 69   Wt 159 lb 6.4 oz (72.3 kg)   LMP 11/16/2014 (Approximate)   SpO2 99%   BMI 24.24 kg/m  Alert and in no distress. Pharyngeal area is normal. Neck is supple without adenopathy or thyromegaly. Cardiac exam shows a regular sinus rhythm without murmurs or gallops. Lungs are clear to auscultation. Skin is warm and dry.       Assessment & Plan:  Night sweats  Perimenopausal vasomotor symptoms  Discussed that her blood pressure is in normal range. Recent onset of night sweats with a hysterectomy for benign issues in 2016. Discussed that most likely her symptoms are related to menopause.  Otherwise she is asymptomatic and in her usual state of health. Reviewed recent labs with her. She will report back if she is have any new or worsening symptoms.  Discussed the possibility of trying black cohosh.  We also talked about triggers that may make  menopausal symptoms worse. Follow-up as needed

## 2018-06-21 NOTE — Patient Instructions (Signed)
Menopause  Menopause is the normal time of life when menstrual periods stop completely. It is usually confirmed by 12 months without a menstrual period. The transition to menopause (perimenopause) most often happens between the ages of 45 and 55. During perimenopause, hormone levels change in your body, which can cause symptoms and affect your health. Menopause may increase your risk for:   Loss of bone (osteoporosis), which causes bone breaks (fractures).   Depression.   Hardening and narrowing of the arteries (atherosclerosis), which can cause heart attacks and strokes.  What are the causes?  This condition is usually caused by a natural change in hormone levels that happens as you get older. The condition may also be caused by surgery to remove both ovaries (bilateral oophorectomy).  What increases the risk?  This condition is more likely to start at an earlier age if you have certain medical conditions or treatments, including:   A tumor of the pituitary gland in the brain.   A disease that affects the ovaries and hormone production.   Radiation treatment for cancer.   Certain cancer treatments, such as chemotherapy or hormone (anti-estrogen) therapy.   Heavy smoking and excessive alcohol use.   Family history of early menopause.  This condition is also more likely to develop earlier in women who are very thin.  What are the signs or symptoms?  Symptoms of this condition include:   Hot flashes.   Irregular menstrual periods.   Night sweats.   Changes in feelings about sex. This could be a decrease in sex drive or an increased comfort around your sexuality.   Vaginal dryness and thinning of the vaginal walls. This may cause painful intercourse.   Dryness of the skin and development of wrinkles.   Headaches.   Problems sleeping (insomnia).   Mood swings or irritability.   Memory problems.   Weight gain.   Hair growth on the face and chest.   Bladder infections or problems with urinating.  How  is this diagnosed?  This condition is diagnosed based on your medical history, a physical exam, your age, your menstrual history, and your symptoms. Hormone tests may also be done.  How is this treated?  In some cases, no treatment is needed. You and your health care provider should make a decision together about whether treatment is necessary. Treatment will be based on your individual condition and preferences. Treatment for this condition focuses on managing symptoms. Treatment may include:   Menopausal hormone therapy (MHT).   Medicines to treat specific symptoms or complications.   Acupuncture.   Vitamin or herbal supplements.  Before starting treatment, make sure to let your health care provider know if you have a personal or family history of:   Heart disease.   Breast cancer.   Blood clots.   Diabetes.   Osteoporosis.  Follow these instructions at home:  Lifestyle   Do not use any products that contain nicotine or tobacco, such as cigarettes and e-cigarettes. If you need help quitting, ask your health care provider.   Get at least 30 minutes of physical activity on 5 or more days each week.   Avoid alcoholic and caffeinated beverages, as well as spicy foods. This may help prevent hot flashes.   Get 7-8 hours of sleep each night.   If you have hot flashes, try:  ? Dressing in layers.  ? Avoiding things that may trigger hot flashes, such as spicy food, warm places, or stress.  ? Taking slow, deep   breaths when a hot flash starts.  ? Keeping a fan in your home and office.   Find ways to manage stress, such as deep breathing, meditation, or journaling.   Consider going to group therapy with other women who are having menopause symptoms. Ask your health care provider about recommended group therapy meetings.  Eating and drinking   Eat a healthy, balanced diet that contains whole grains, lean protein, low-fat dairy, and plenty of fruits and vegetables.   Your health care provider may recommend  adding more soy to your diet. Foods that contain soy include tofu, tempeh, and soy milk.   Eat plenty of foods that contain calcium and vitamin D for bone health. Items that are rich in calcium include low-fat milk, yogurt, beans, almonds, sardines, broccoli, and kale.  Medicines   Take over-the-counter and prescription medicines only as told by your health care provider.   Talk with your health care provider before starting any herbal supplements. If prescribed, take vitamins and supplements as told by your health care provider. These may include:  ? Calcium. Women age 51 and older should get 1,200 mg (milligrams) of calcium every day.  ? Vitamin D. Women need 600-800 International Units of vitamin D each day.  ? Vitamins B12 and B6. Aim for 50 micrograms of B12 and 1.5 mg of B6 each day.  General instructions   Keep track of your menstrual periods, including:  ? When they occur.  ? How heavy they are and how long they last.  ? How much time passes between periods.   Keep track of your symptoms, noting when they start, how often you have them, and how long they last.   Use vaginal lubricants or moisturizers to help with vaginal dryness and improve comfort during sex.   Keep all follow-up visits as told by your health care provider. This is important. This includes any group therapy or counseling.  Contact a health care provider if:   You are still having menstrual periods after age 55.   You have pain during sex.   You have not had a period for 12 months and you develop vaginal bleeding.  Get help right away if:   You have:  ? Severe depression.  ? Excessive vaginal bleeding.  ? Pain when you urinate.  ? A fast or irregular heart beat (palpitations).  ? Severe headaches.  ? Abdomen (abdominal) pain or severe indigestion.   You fell and you think you have a broken bone.   You develop leg or chest pain.   You develop vision problems.   You feel a lump in your breast.  Summary   Menopause is the normal  time of life when menstrual periods stop completely. It is usually confirmed by 12 months without a menstrual period.   The transition to menopause (perimenopause) most often happens between the ages of 45 and 55.   Symptoms can be managed through medicines, lifestyle changes, and complementary therapies such as acupuncture.   Eat a balanced diet that is rich in nutrients to promote bone health and heart health and to manage symptoms during menopause.  This information is not intended to replace advice given to you by your health care provider. Make sure you discuss any questions you have with your health care provider.  Document Released: 09/06/2003 Document Revised: 07/19/2016 Document Reviewed: 07/19/2016  Elsevier Interactive Patient Education  2019 [employer]vier Inc.

## 2018-06-22 ENCOUNTER — Encounter: Payer: Self-pay | Admitting: Family Medicine

## 2018-09-01 ENCOUNTER — Encounter: Payer: Self-pay | Admitting: Medical

## 2018-09-01 ENCOUNTER — Ambulatory Visit (INDEPENDENT_AMBULATORY_CARE_PROVIDER_SITE_OTHER): Payer: BLUE CROSS/BLUE SHIELD | Admitting: Medical

## 2018-09-01 VITALS — BP 120/70 | HR 58 | Temp 97.7°F | Resp 16 | Ht 68.0 in | Wt 163.0 lb

## 2018-09-01 DIAGNOSIS — R300 Dysuria: Secondary | ICD-10-CM | POA: Diagnosis not present

## 2018-09-01 DIAGNOSIS — B379 Candidiasis, unspecified: Secondary | ICD-10-CM

## 2018-09-01 DIAGNOSIS — M545 Low back pain, unspecified: Secondary | ICD-10-CM

## 2018-09-01 DIAGNOSIS — N898 Other specified noninflammatory disorders of vagina: Secondary | ICD-10-CM | POA: Diagnosis not present

## 2018-09-01 LAB — POCT WET PREP (WET MOUNT)
Clue Cells Wet Prep Whiff POC: POSITIVE
Trichomonas Wet Prep HPF POC: ABSENT

## 2018-09-01 LAB — POCT URINALYSIS DIP (PROADVANTAGE DEVICE)
Bilirubin, UA: NEGATIVE
Blood, UA: NEGATIVE
Glucose, UA: NEGATIVE mg/dL
Ketones, POC UA: NEGATIVE mg/dL
Leukocytes, UA: NEGATIVE
Nitrite, UA: NEGATIVE
Protein Ur, POC: NEGATIVE mg/dL
Specific Gravity, Urine: 1.02
Urobilinogen, Ur: NEGATIVE
pH, UA: 6.5 (ref 5.0–8.0)

## 2018-09-01 MED ORDER — FLUCONAZOLE 150 MG PO TABS: ORAL_TABLET | ORAL | 0 refills | Status: DC

## 2018-09-01 NOTE — Progress Notes (Signed)
Subjective:     Patient ID: Helen Walker, female   DOB: 01/09/1966, 53 y.o.   MRN: [id number]  HPI Chief Complaint  Patient presents with  . lower back pain    lower back pain, bladder infection X  1 week   Here for possible bladder infection.  Has some low back pain.  She has hx/o some back problems.  However, started having vaginal odor last 2 days, worried about infection such as UTI.  Denies urinary frequency, maybe some urgency.  No burning or pain with urination.    No blood in urine.  No urine odor.   No vaginal discharge.   No pain with intercourse.   No fever, no NVD.   No constipation.    Has boyfriend for while, no particular concern for STD.   Last time she had any vaginal or UTI infection was a while ago.   No recent injury or fall.   No strenuous activity.  Was seeing PT in 02/2018 given hx/o back problems and scoliosis.  She has history of hysterectomy due to fibroids and history of appendectomy.  No prior pelvic or gynecological cancer.  No other aggravating or relieving factors. No other complaint.   Past Medical History:  Diagnosis Date  . Bladder mass 2013  . Medical history non-contributory    Current Outpatient Medications on File Prior to Visit  Medication Sig Dispense Refill  . acetaminophen (TYLENOL) 500 MG tablet Take 500 mg by mouth every 6 (six) hours as needed.    . naproxen (NAPROSYN) 500 MG tablet Take 1 tablet (500 mg total) by mouth 2 (two) times daily with a meal. 30 tablet 0  . valACYclovir (VALTREX) 500 MG tablet Take 1 tablet (500 mg total) by mouth 2 (two) times daily. X 3 days for flare up 60 tablet 1  . meclizine (ANTIVERT) 25 MG tablet Take 1 tablet (25 mg total) by mouth 2 (two) times daily as needed for dizziness. (Patient not taking: Reported on 06/21/2018) 30 tablet 0   No current facility-administered medications on file prior to visit.     Review of Systems ROS as in subjective      Objective:   Physical Exam BP 120/70   Pulse (!) 58    Temp 97.7 F (36.5 C) (Oral)   Resp 16   Ht 5\' 8"  (1.727 m)   Wt 163 lb (73.9 kg)   LMP 11/16/2014 (Approximate)   SpO2 99%   BMI 24.78 kg/m   General well-developed well-nourished no acute distress African-American female Back nontender, no pain with range of motion today Abdomen with mild generalized tenderness in the right and lower abdomen, no mass no organomegaly Gyn: Normal external genitalia without lesions, vagina with normal mucosa,s/p hysterectomy, no cervical motion tenderness, mild white thicker vaginal discharge.  Adnexa not enlarged, nontender, no masses.  Exam chaperoned by nurse.      Assessment:     Encounter Diagnoses  Name Primary?  . Low back pain without sciatica, unspecified back pain laterality, unspecified chronicity Yes  . Yeast infection   . Dysuria   . Vaginal odor        Plan:     Urinalysis unremarkable, urine culture sent to verify no UTI  Wet prep shows lots of yeast, so we will treat for yeast infection with Diflucan as below.  Discussed hygiene, prevention measures.  She declines STD screen  She has underlying low back pain and scoliosis but deals with this with routine stretching  and exercise and not specifically concerned about this today  Analeah was seen today for lower back pain.  Diagnoses and all orders for this visit:  Low back pain without sciatica, unspecified back pain laterality, unspecified chronicity -     POCT Urinalysis DIP (Proadvantage Device)  Yeast infection -     POCT Wet Prep [facility])  Dysuria -     Urine Culture  Vaginal odor -     POCT Wet Prep [facility])  Other orders -     fluconazole (DIFLUCAN) 150 MG tablet; 1 tablet weekly

## 2018-09-02 LAB — URINE CULTURE

## 2018-09-10 ENCOUNTER — Telehealth: Payer: Self-pay | Admitting: Family Medicine

## 2018-09-10 MED ORDER — VALACYCLOVIR HCL 500 MG PO TABS: 500.0000 mg | ORAL_TABLET | Freq: Two times a day (BID) | ORAL | 1 refills | Status: DC

## 2018-09-10 NOTE — Telephone Encounter (Signed)
Pt left message and needs refill for Valtrex she uses the [facility] on [address].

## 2018-09-10 NOTE — Telephone Encounter (Signed)
done

## 2018-09-10 NOTE — Telephone Encounter (Signed)
Ok to refill 

## 2019-03-23 ENCOUNTER — Encounter: Payer: Self-pay | Admitting: Gynecology

## 2019-05-09 ENCOUNTER — Other Ambulatory Visit: Payer: Self-pay

## 2019-05-09 DIAGNOSIS — Z20822 Contact with and (suspected) exposure to covid-19: Secondary | ICD-10-CM

## 2019-05-10 LAB — NOVEL CORONAVIRUS, NAA: SARS-CoV-2, NAA: NOT DETECTED

## 2019-07-19 ENCOUNTER — Ambulatory Visit: Payer: BLUE CROSS/BLUE SHIELD | Attending: Internal Medicine

## 2019-07-19 DIAGNOSIS — Z20822 Contact with and (suspected) exposure to covid-19: Secondary | ICD-10-CM

## 2019-07-20 LAB — NOVEL CORONAVIRUS, NAA: SARS-CoV-2, NAA: NOT DETECTED

## 2019-07-26 IMAGING — CR DG LUMBAR SPINE COMPLETE 4+V
5 series · 5 of 5 positions shown · non-contrast
Comparison: None.

CLINICAL DATA: Left low back pain for 2 weeks radiating to the leg
and foot.

EXAM:
LUMBAR SPINE - COMPLETE 4+ VIEW

[t l-spine a.p.]
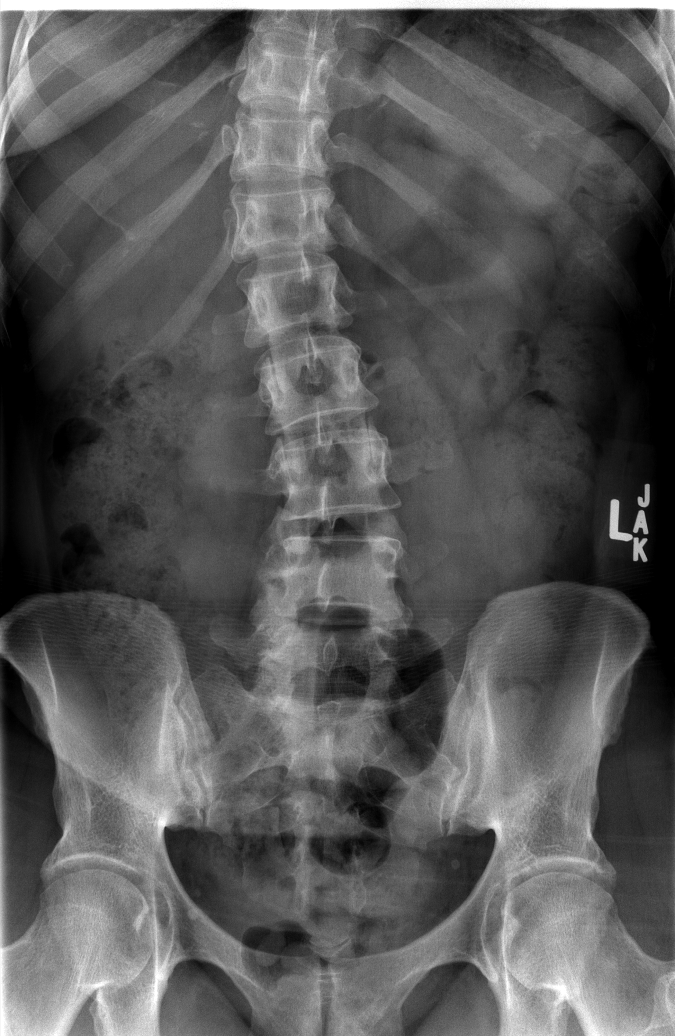

[t l-spine oblique exposure (1 of 2)]
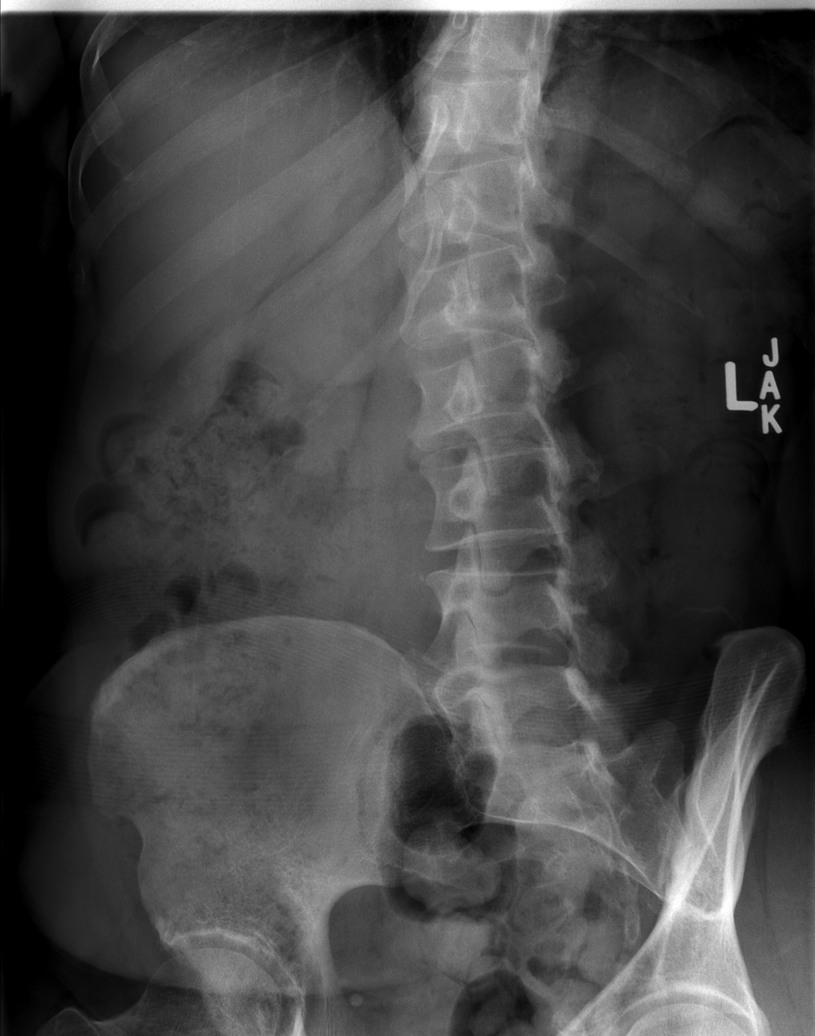

[t l-spine oblique exposure (2 of 2)]
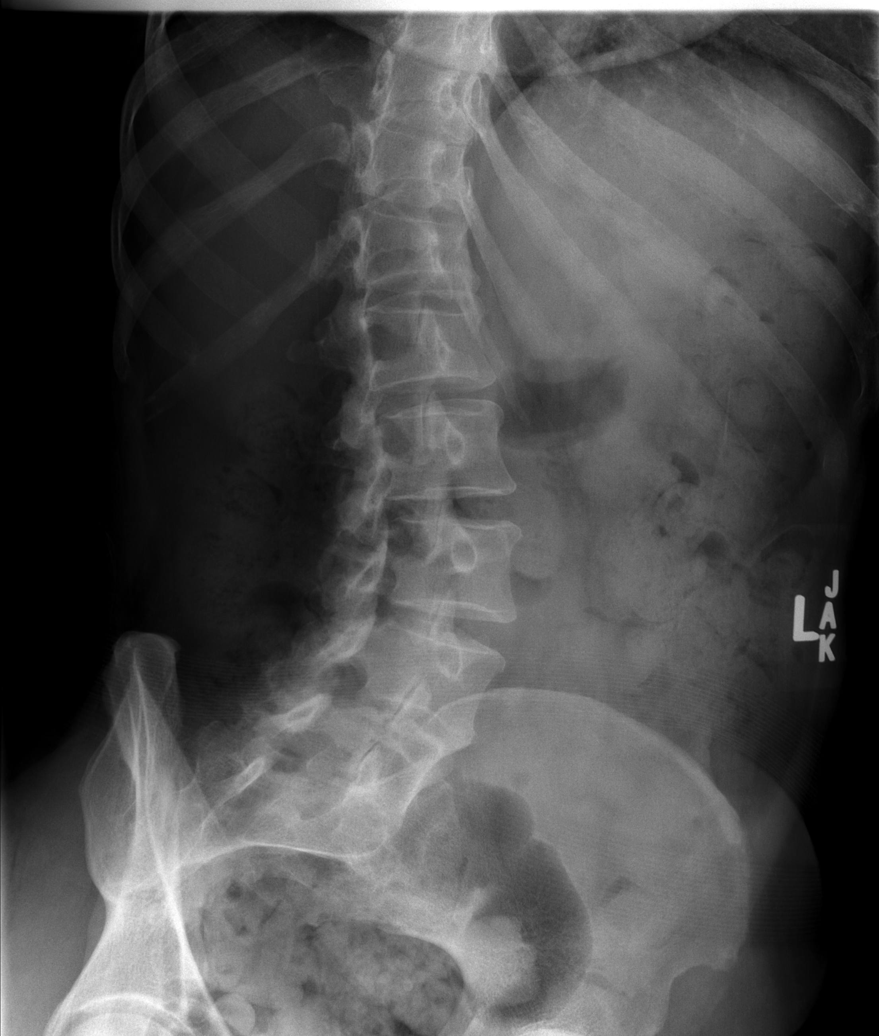

[t l-spine lat]
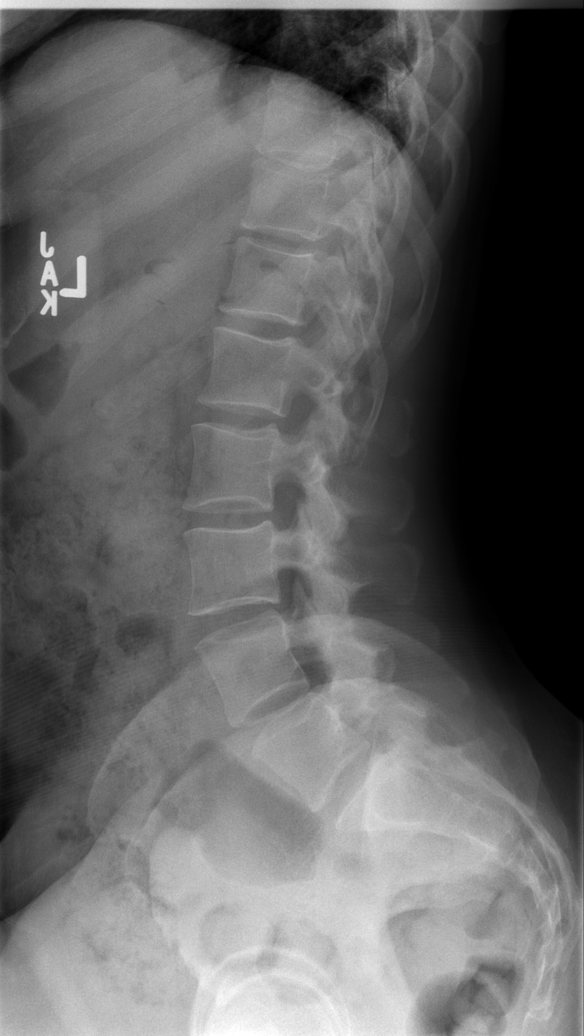

[t l-spine l5-s1 spot]
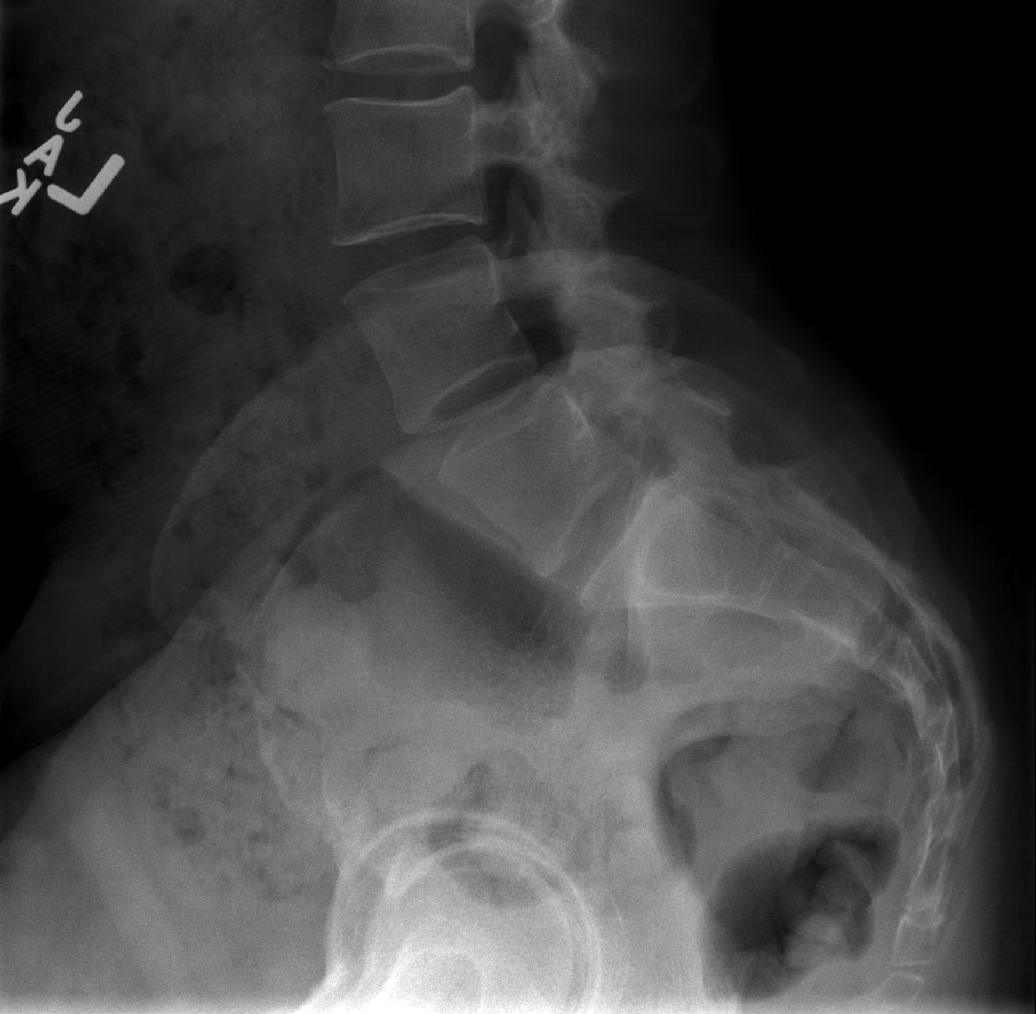

[5 of 5 positions shown; findings below may reference images not displayed]

FINDINGS: There 5 non rib-bearing lumbar type vertebrae. Mild lower thoracic
dextroscoliosis is partially visualized, and there is a slight
compensatory levoconvex lower lumbar curvature. Sagittal alignment
is normal. Vertebral body heights are preserved without evidence of
fracture. There is evidence of likely moderate right facet arthrosis
at L4-5 and L5-S1 with milder facet arthrosis on the left.
Intervertebral disc space heights are preserved aside from minimal
right-sided narrowing on the concave side of the scoliosis. The
visualized soft tissues are unremarkable.
IMPRESSION: 1. S-shaped thoracolumbar scoliosis.
2. Right greater than left lower lumbar facet arthrosis.

## 2019-12-16 ENCOUNTER — Ambulatory Visit
Admission: EM | Admit: 2019-12-16 | Discharge: 2019-12-16 | Disposition: A | Discharge status: Home / Self Care | Payer: Self-pay | Attending: Emergency Medicine | Admitting: Emergency Medicine

## 2019-12-16 ENCOUNTER — Other Ambulatory Visit: Payer: Self-pay

## 2019-12-16 ENCOUNTER — Encounter: Payer: Self-pay | Admitting: Emergency Medicine

## 2019-12-16 DIAGNOSIS — M5431 Sciatica, right side: Secondary | ICD-10-CM

## 2019-12-16 MED ORDER — NAPROXEN 500 MG PO TABS: 500.0000 mg | ORAL_TABLET | Freq: Two times a day (BID) | ORAL | 0 refills | Status: DC

## 2019-12-16 NOTE — ED Triage Notes (Signed)
Pt c/o acute right leg pain from buttocks area to hamstring after colliding with post this morning. Denies numbness to foot/toes. N/v intact.

## 2019-12-16 NOTE — ED Provider Notes (Signed)
[facility]    CSN: [id number] Arrival date & time: 12/16/19  1001      History   Chief Complaint Chief Complaint  Patient presents with  . Leg Pain    HPI Helen Walker is a 54 y.o. female presenting for right low back and right leg pain.  States pain is lower right glutes, radiates down to foot.  Has had some tingling in her foot without discoloration or numbness.  States she was running and stepped on a fallen stop sign which "jarred her ".  Denies fall, head trauma.  States this occurred early this morning.  Has not taken thing for this.   Past Medical History:  Diagnosis Date  . Bladder mass 2013  . Medical history non-contributory     Patient Active Problem List   Diagnosis Date Noted  . Prediabetes 10/25/2015  . Encounter for postoperative care 01/23/2015  . Perimenopausal vasomotor symptoms 12/07/2014  . Genital herpes 11/25/2012    Past Surgical History:  Procedure Laterality Date  . APPENDECTOMY N/A 01/23/2015   Procedure: APPENDECTOMY;  Surgeon: Terrance Mass, MD;  Location: [facility];  Service: Gynecology;  Laterality: N/A;  . [place]  . CYSTOSCOPY  07/11/2011   Procedure: CYSTOSCOPY;  Surgeon: Hanley Ben, MD;  Location: [facility];  Service: Urology;  Laterality: N/A;  TUR Bladder Mass  . CYSTOSCOPY W/ RETROGRADES  07/11/2011   Procedure: CYSTOSCOPY WITH RETROGRADE PYELOGRAM;  Surgeon: Hanley Ben, MD;  Location: [facility];  Service: Urology;  Laterality: Bilateral;  retrograde of bladder  . HYSTEROSCOPY W/ ENDOMETRIAL ABLATION  07-17-2006  . LAPAROSCOPIC ASSISTED VAGINAL HYSTERECTOMY N/A 01/23/2015   Procedure:  total abdominal hysterectomy , right salpingectomy oophorectomy;  Surgeon: Terrance Mass, MD;  Location: [facility];  Service: Gynecology;  Laterality: N/A;  request to follow first case  requests 2 1/2 hours OR time.  needs cystoscope and harmonic scalpel  .  LAPAROSCOPIC LYSIS OF ADHESIONS N/A 01/23/2015   Procedure: LAPAROSCOPIC LYSIS OF ADHESIONS;  Surgeon: Terrance Mass, MD;  Location: [facility];  Service: Gynecology;  Laterality: N/A;  . TRANSURETHRAL RESECTION OF BLADDER TUMOR  07/11/2011   Procedure: TRANSURETHRAL RESECTION OF BLADDER TUMOR (TURBT);  Surgeon: Hanley Ben, MD;  Location: [facility];  Service: Urology;  Laterality: N/A;  . TUBAL LIGATION  1995    OB History    Gravida  3   Para  2   Term  2   Preterm      AB  1   Living  2     SAB      TAB      Ectopic      Multiple      Live Births  2            Home Medications    Prior to Admission medications   Medication Sig Start Date End Date Taking? Authorizing Provider  acetaminophen (TYLENOL) 500 MG tablet Take 500 mg by mouth every 6 (six) hours as needed.    [provider]  meclizine (ANTIVERT) 25 MG tablet Take 1 tablet (25 mg total) by mouth 2 (two) times daily as needed for dizziness. Patient not taking: Reported on 06/21/2018 10/20/17   Harland Dingwall L, NP-C  naproxen (NAPROSYN) 500 MG tablet Take 1 tablet (500 mg total) by mouth 2 (two) times daily. 12/16/19   Hall-Potvin, [place], PA-C  valACYclovir (VALTREX) 500 MG tablet  Take 1 tablet (500 mg total) by mouth 2 (two) times daily. X 3 days for flare up 09/10/18   Girtha Rm, NP-C    Family History Family History  Problem Relation Age of Onset  . Hypertension Mother   . Diabetes Mother   . Breast cancer Mother 45  . Kidney disease Paternal Grandmother     Social History Social History   Tobacco Use  . Smoking status: Never Smoker  . Smokeless tobacco: Never Used  Vaping Use  . Vaping Use: Never used  Substance Use Topics  . Alcohol use: Yes    Alcohol/week: 0.0 standard drinks    Comment: OCCASIONAL  . Drug use: No     Allergies   Patient has no known allergies.   Review of Systems As per HPI   Physical Exam Triage Vital Signs ED  Triage Vitals  Enc Vitals Group     BP      Pulse      Resp      Temp      Temp src      SpO2      Weight      Height      Head Circumference      Peak Flow      Pain Score      Pain Loc      Pain Edu?      Excl. in [place]?    No data found.  Updated Vital Signs BP 131/80 (BP Location: Left Arm)   Pulse 85   Temp 98 F (36.7 C) (Oral)   Resp 16   LMP 11/16/2014 (Approximate)   SpO2 97%   Visual Acuity Right Eye Distance:   Left Eye Distance:   Bilateral Distance:    Right Eye Near:   Left Eye Near:    Bilateral Near:     Physical Exam Constitutional:      General: She is not in acute distress.    Appearance: She is normal weight. She is not ill-appearing.  HENT:     Head: Normocephalic and atraumatic.  Eyes:     General: No scleral icterus.    Pupils: Pupils are equal, round, and reactive to light.  Cardiovascular:     Rate and Rhythm: Normal rate.  Pulmonary:     Effort: Pulmonary effort is normal.  Musculoskeletal:     Comments: Right lower gluteal TTP at insertion of sciatic nerve.  No PSIS or spinous process tenderness.  Full active ROM.  Positive SLR of right.  NVI  Skin:    Coloration: Skin is not jaundiced or pale.  Neurological:     Mental Status: She is alert and oriented to person, place, and time.      UC Treatments / Results  Labs (all labs ordered are listed, but only abnormal results are displayed) Labs Reviewed - No data to display  EKG   Radiology No results found.  Procedures Procedures (including critical care time)  Medications Ordered in UC Medications - No data to display  Initial Impression / Assessment and Plan / UC Course  I have reviewed the triage vital signs and the nursing notes.  Pertinent labs & imaging results that were available during my care of the patient were reviewed by me and considered in my medical decision making (see chart for details).     To be consistent with right-sided sciatica.  We will  treat supportively as outlined below.  Return precautions discussed, patient verbalized understanding  and is agreeable to plan. Final Clinical Impressions(s) / UC Diagnoses   Final diagnoses:  Right sided sciatica     Discharge Instructions     Recommend RICE: rest, ice, compression, elevation as needed for pain.   Cold therapy (ice packs) can be used to help swelling both after injury and after prolonged use of areas of chronic pain/aches.  For pain: Take naproxen twice daily with food.  May take Tylenol additionally if needed.    ED Prescriptions    Medication Sig Dispense Auth. Provider   naproxen (NAPROSYN) 500 MG tablet Take 1 tablet (500 mg total) by mouth 2 (two) times daily. 30 tablet Hall-Potvin, [place], PA-C     PDMP not reviewed this encounter.   Hall-Potvin, [place], [place] 12/16/19 1018

## 2019-12-16 NOTE — Discharge Instructions (Signed)
Recommend RICE: rest, ice, compression, elevation as needed for pain.   Cold therapy (ice packs) can be used to help swelling both after injury and after prolonged use of areas of chronic pain/aches.  For pain: Take naproxen twice daily with food.  May take Tylenol additionally if needed.

## 2020-05-28 ENCOUNTER — Other Ambulatory Visit: Payer: Self-pay

## 2020-05-28 ENCOUNTER — Other Ambulatory Visit: Payer: Self-pay | Admitting: Family Medicine

## 2020-05-28 ENCOUNTER — Ambulatory Visit
Admission: RE | Admit: 2020-05-28 | Discharge: 2020-05-28 | Disposition: A | Discharge status: Home / Self Care | Payer: Self-pay | Source: Ambulatory Visit | Attending: Family Medicine | Admitting: Family Medicine

## 2020-05-28 DIAGNOSIS — Z1231 Encounter for screening mammogram for malignant neoplasm of breast: Secondary | ICD-10-CM

## 2020-06-26 ENCOUNTER — Encounter: Payer: Self-pay | Admitting: Family Medicine

## 2020-07-02 ENCOUNTER — Ambulatory Visit: Payer: 59 | Admitting: Obstetrics

## 2020-07-03 ENCOUNTER — Other Ambulatory Visit (INDEPENDENT_AMBULATORY_CARE_PROVIDER_SITE_OTHER): Payer: 59

## 2020-07-03 ENCOUNTER — Encounter: Payer: Self-pay | Admitting: Medical

## 2020-07-03 ENCOUNTER — Telehealth (INDEPENDENT_AMBULATORY_CARE_PROVIDER_SITE_OTHER): Payer: 59 | Admitting: Medical

## 2020-07-03 ENCOUNTER — Other Ambulatory Visit: Payer: Self-pay

## 2020-07-03 ENCOUNTER — Telehealth: Payer: Self-pay | Admitting: Family Medicine

## 2020-07-03 VITALS — Ht 68.0 in | Wt 155.0 lb

## 2020-07-03 DIAGNOSIS — J988 Other specified respiratory disorders: Secondary | ICD-10-CM | POA: Diagnosis not present

## 2020-07-03 DIAGNOSIS — R0981 Nasal congestion: Secondary | ICD-10-CM

## 2020-07-03 DIAGNOSIS — R0989 Other specified symptoms and signs involving the circulatory and respiratory systems: Secondary | ICD-10-CM

## 2020-07-03 LAB — POC COVID19 BINAXNOW: SARS Coronavirus 2 Ag: POSITIVE — AB

## 2020-07-03 NOTE — Progress Notes (Signed)
Subjective:     Patient ID: Helen Walker, female   DOB: 1966-03-02, 55 y.o.   MRN: [id number]  This visit type was conducted due to national recommendations for restrictions regarding the COVID-19 Pandemic (e.g. social distancing) in an effort to limit this patient's exposure and mitigate transmission in our community.  Due to their co-morbid illnesses, this patient is at least at moderate risk for complications without adequate follow up.  This format is felt to be most appropriate for this patient at this time.    Documentation for virtual audio and video telecommunications through [facility] encounter:  The patient was located at home. The provider was located in the office. The patient did consent to this visit and is aware of possible charges through their insurance for this visit.  The other persons participating in this telemedicine service were none. Time spent on call was 20 minutes and in review of previous records 20 minutes total.  This virtual service is not related to other E/M service within previous 7 days.   HPI Chief Complaint  Patient presents with  . Nasal Congestion    Throat scratchy, runny nose. Symptoms started last night    Virtual consult for illness.  She notes symptoms x1 day including nasal congestion, runny nose, scratchy throat.  Started alka seltzer OTC.  No body aches, no chills, no NVD, no change in smell or taste.  No SOB, no wheezing.  Did her morning exercise with no problems.  No significant headache or fatigue.      Suppose to go to [place] later this week.     Wanted to get checked out.  No recent sick contacts, no covid contacts.  Has had the covid vaccine and booster.    No other aggravating or relieving factors. No other complaint.  Past Medical History:  Diagnosis Date  . Bladder mass 2013  . Medical history non-contributory     Review of Systems As in subjective    Objective:   Physical Exam Due to coronavirus pandemic stay  at home measures, patient visit was virtual and they were not examined in person.   Ht 5\' 8"  (1.727 m)   Wt 155 lb (70.3 kg)   LMP 11/16/2014 (Approximate)   BMI 23.57 kg/m   General well-developed well-nourished no acute distress No obvious dyspnea or wheezing      Assessment:     Encounter Diagnoses  Name Primary?  . Runny nose Yes  . Respiratory tract infection   . Head congestion        Plan:     She has plans to fly out to [facility] on Thursday but has symptoms that just started yesterday. She will come in today in our back parking lot for Covid screening. In the meantime we discussed rest, hydration, supportive therapy over-the-counter for cough congestion fever and body aches, we discussed usual timeframe for symptoms of the cold versus flu or Covid symptoms. Right now is too early to tell what her symptoms are going to develop to. She is fully vaccinated and has not been around a lot of folks recently so this may just be a mild cold in the early stages  Advised if worse in the coming days particular short of breath or dehydration or uncontrollable vomiting to call back  Quarantine until we get the testing back with further info  Helen Walker was seen today for nasal congestion.  Diagnoses and all orders for this visit:  Runny nose -  POC COVID-19 [employer]axNow; Future -     Novel Coronavirus, NAA ([facility]); Future  Respiratory tract infection -     POC COVID-19 BinaxNow; Future -     Novel Coronavirus, NAA ([facility]); Future  Head congestion -     POC COVID-19 [employer]axNow; Future -     Novel Coronavirus, NAA ([facility]); Future  Follow-up in our back parking lot for Covid testing

## 2020-07-03 NOTE — Telephone Encounter (Signed)
Pt called and states that she needs a letter that she was covid + so she can send to the airline so she can get a refund for her flight  Is this ok  Please email letter to pt email is correct on file

## 2020-07-04 ENCOUNTER — Encounter: Payer: Self-pay | Admitting: Family Medicine

## 2020-07-04 ENCOUNTER — Encounter: Payer: Self-pay | Admitting: Medical

## 2020-07-04 NOTE — Telephone Encounter (Signed)
See message.  Please write letter on her behalf showing the date of the positive test for Covid as she would be contagious and unsafe to fly

## 2020-07-05 ENCOUNTER — Telehealth: Payer: Self-pay

## 2020-07-05 NOTE — Telephone Encounter (Signed)
Letter already done.

## 2020-07-16 ENCOUNTER — Ambulatory Visit: Payer: 59 | Admitting: Obstetrics

## 2020-07-23 ENCOUNTER — Ambulatory Visit: Payer: 59 | Admitting: Obstetrics

## 2020-08-06 ENCOUNTER — Ambulatory Visit: Payer: 59 | Admitting: Obstetrics

## 2020-08-09 ENCOUNTER — Other Ambulatory Visit: Payer: Self-pay

## 2020-08-09 ENCOUNTER — Ambulatory Visit (INDEPENDENT_AMBULATORY_CARE_PROVIDER_SITE_OTHER): Payer: 59 | Admitting: Family Medicine

## 2020-08-09 ENCOUNTER — Encounter: Payer: Self-pay | Admitting: Family Medicine

## 2020-08-09 VITALS — BP 120/70 | HR 78 | Ht 66.5 in | Wt 152.0 lb

## 2020-08-09 DIAGNOSIS — R7303 Prediabetes: Secondary | ICD-10-CM

## 2020-08-09 DIAGNOSIS — E7800 Pure hypercholesterolemia, unspecified: Secondary | ICD-10-CM

## 2020-08-09 DIAGNOSIS — Z1211 Encounter for screening for malignant neoplasm of colon: Secondary | ICD-10-CM

## 2020-08-09 DIAGNOSIS — Z0000 Encounter for general adult medical examination without abnormal findings: Secondary | ICD-10-CM

## 2020-08-09 DIAGNOSIS — A6000 Herpesviral infection of urogenital system, unspecified: Secondary | ICD-10-CM

## 2020-08-09 DIAGNOSIS — Z113 Encounter for screening for infections with a predominantly sexual mode of transmission: Secondary | ICD-10-CM

## 2020-08-09 MED ORDER — VALACYCLOVIR HCL 500 MG PO TABS
500.0000 mg | ORAL_TABLET | Freq: Two times a day (BID) | ORAL | 1 refills | Status: DC
Start: 2020-08-09 — End: 2023-07-29

## 2020-08-09 NOTE — Patient Instructions (Addendum)
You can take over the counter Dramamine or the non drowsy meclizine for motion sickness.   Continue eating a healthy diet and taking good care of yourself by exercising    Preventive Care 52-55 Years Old, Female Preventive care refers to lifestyle choices and visits with your health care provider that can promote health and wellness. This includes:  A yearly physical exam. This is also called an annual wellness visit.  Regular dental and eye exams.  Immunizations.  Screening for certain conditions.  Healthy lifestyle choices, such as: ? Eating a healthy diet. ? Getting regular exercise. ? Not using drugs or products that contain nicotine and tobacco. ? Limiting alcohol use. What can I expect for my preventive care visit? Physical exam Your health care provider will check your:  Height and weight. These may be used to calculate your BMI (body mass index). BMI is a measurement that tells if you are at a healthy weight.  Heart rate and blood pressure.  Body temperature.  Skin for abnormal spots. Counseling Your health care provider may ask you questions about your:  Past medical problems.  Family's medical history.  Alcohol, tobacco, and drug use.  Emotional well-being.  Home life and relationship well-being.  Sexual activity.  Diet, exercise, and sleep habits.  Work and work Statistician.  Access to firearms.  Method of birth control.  Menstrual cycle.  Pregnancy history. What immunizations do I need? Vaccines are usually given at various ages, according to a schedule. Your health care provider will recommend vaccines for you based on your age, medical history, and lifestyle or other factors, such as travel or where you work.   What tests do I need? Blood tests  Lipid and cholesterol levels. These may be checked every 5 years, or more often if you are over 80 years old.  Hepatitis C test.  Hepatitis B test. Screening  Lung cancer screening. You may  have this screening every year starting at age 34 if you have a 30-pack-year history of smoking and currently smoke or have quit within the past 15 years.  Colorectal cancer screening. ? All adults should have this screening starting at age 51 and continuing until age 28. ? Your health care provider may recommend screening at age 12 if you are at increased risk. ? You will have tests every 1-10 years, depending on your results and the type of screening test.  Diabetes screening. ? This is done by checking your blood sugar (glucose) after you have not eaten for a while (fasting). ? You may have this done every 1-3 years.  Mammogram. ? This may be done every 1-2 years. ? Talk with your health care provider about when you should start having regular mammograms. This may depend on whether you have a family history of breast cancer.  BRCA-related cancer screening. This may be done if you have a family history of breast, ovarian, tubal, or peritoneal cancers.  Pelvic exam and Pap test. ? This may be done every 3 years starting at age 4. ? Starting at age 38, this may be done every 5 years if you have a Pap test in combination with an HPV test. Other tests  STD (sexually transmitted disease) testing, if you are at risk.  Bone density scan. This is done to screen for osteoporosis. You may have this scan if you are at high risk for osteoporosis. Talk with your health care provider about your test results, treatment options, and if necessary, the need for more  tests. Follow these instructions at home: Eating and drinking  Eat a diet that includes fresh fruits and vegetables, whole grains, lean protein, and low-fat dairy products.  Take vitamin and mineral supplements as recommended by your health care provider.  Do not drink alcohol if: ? Your health care provider tells you not to drink. ? You are pregnant, may be pregnant, or are planning to become pregnant.  If you drink  alcohol: ? Limit how much you have to 0-1 drink a day. ? Be aware of how much alcohol is in your drink. In the [place] one drink equals one 12 oz bottle of beer (355 mL), one 5 oz glass of wine (148 mL), or one 1 oz glass of hard liquor (44 mL).   Lifestyle  Take daily care of your teeth and gums. Brush your teeth every morning and night with fluoride toothpaste. Floss one time each day.  Stay active. Exercise for at least 30 minutes 5 or more days each week.  Do not use any products that contain nicotine or tobacco, such as cigarettes, e-cigarettes, and chewing tobacco. If you need help quitting, ask your health care provider.  Do not use drugs.  If you are sexually active, practice safe sex. Use a condom or other form of protection to prevent STIs (sexually transmitted infections).  If you do not wish to become pregnant, use a form of birth control. If you plan to become pregnant, see your health care provider for a prepregnancy visit.  If told by your health care provider, take low-dose aspirin daily starting at age 52.  Find healthy ways to cope with stress, such as: ? Meditation, yoga, or listening to music. ? Journaling. ? Talking to a trusted person. ? Spending time with friends and family. Safety  Always wear your seat belt while driving or riding in a vehicle.  Do not drive: ? If you have been drinking alcohol. Do not ride with someone who has been drinking. ? When you are tired or distracted. ? While texting.  Wear a helmet and other protective equipment during sports activities.  If you have firearms in your house, make sure you follow all gun safety procedures. What's next?  Visit your health care provider once a year for an annual wellness visit.  Ask your health care provider how often you should have your eyes and teeth checked.  Stay up to date on all vaccines. This information is not intended to replace advice given to you by your health care provider. Make  sure you discuss any questions you have with your health care provider. Document Revised: 03/20/2020 Document Reviewed: 02/25/2018 Elsevier Patient Education  2021 [employer].
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
## 2020-08-10 LAB — CBC WITH DIFFERENTIAL/PLATELET
Basophils Absolute: 0.1 10*3/uL (ref 0.0–0.2)
Basos: 1 %
EOS (ABSOLUTE): 0 10*3/uL (ref 0.0–0.4)
Eos: 1 %
Hematocrit: 38.9 % (ref 34.0–46.6)
Hemoglobin: 13.2 g/dL (ref 11.1–15.9)
Immature Grans (Abs): 0 10*3/uL (ref 0.0–0.1)
Immature Granulocytes: 0 %
Lymphocytes Absolute: 2.1 10*3/uL (ref 0.7–3.1)
Lymphs: 40 %
MCH: 29.1 pg (ref 26.6–33.0)
MCHC: 33.9 g/dL (ref 31.5–35.7)
MCV: 86 fL (ref 79–97)
Monocytes Absolute: 0.4 10*3/uL (ref 0.1–0.9)
Monocytes: 7 %
Neutrophils Absolute: 2.7 10*3/uL (ref 1.4–7.0)
Neutrophils: 51 %
Platelets: 344 10*3/uL (ref 150–450)
RBC: 4.53 x10E6/uL (ref 3.77–5.28)
RDW: 12.9 % (ref 11.7–15.4)
WBC: 5.2 10*3/uL (ref 3.4–10.8)

## 2020-08-10 LAB — COMPREHENSIVE METABOLIC PANEL
ALT: 12 IU/L (ref 0–32)
AST: 14 IU/L (ref 0–40)
Albumin/Globulin Ratio: 1.3 (ref 1.2–2.2)
Albumin: 4.7 g/dL (ref 3.8–4.9)
Alkaline Phosphatase: 71 IU/L (ref 44–121)
BUN/Creatinine Ratio: 19 (ref 9–23)
BUN: 18 mg/dL (ref 6–24)
Bilirubin Total: 0.6 mg/dL (ref 0.0–1.2)
CO2: 23 mmol/L (ref 20–29)
Calcium: 9.7 mg/dL (ref 8.7–10.2)
Chloride: 103 mmol/L (ref 96–106)
Creatinine, Ser: 0.97 mg/dL (ref 0.57–1.00)
GFR calc Af Amer: 77 mL/min/{1.73_m2} (ref >59)
GFR calc non Af Amer: 66 mL/min/{1.73_m2} (ref >59)
Globulin, Total: 3.5 g/dL (ref 1.5–4.5)
Glucose: 100 mg/dL — ABNORMAL HIGH (ref 65–99)
Potassium: 4.8 mmol/L (ref 3.5–5.2)
Sodium: 140 mmol/L (ref 134–144)
Total Protein: 8.2 g/dL (ref 6.0–8.5)

## 2020-08-10 LAB — LIPID PANEL
Chol/HDL Ratio: 4.1 ratio (ref 0.0–4.4)
Cholesterol, Total: 191 mg/dL (ref 100–199)
HDL: 47 mg/dL (ref >39)
LDL Chol Calc (NIH): 133 mg/dL — ABNORMAL HIGH (ref 0–99)
Triglycerides: 59 mg/dL (ref 0–149)
VLDL Cholesterol Cal: 11 mg/dL (ref 5–40)

## 2020-08-10 LAB — T4, FREE: Free T4: 1.36 ng/dL (ref 0.82–1.77)

## 2020-08-10 LAB — HEMOGLOBIN A1C
Est. average glucose Bld gHb Est-mCnc: 126 mg/dL
Hgb A1c MFr Bld: 6 % — ABNORMAL HIGH (ref 4.8–5.6)

## 2020-08-10 LAB — HIV ANTIBODY (ROUTINE TESTING W REFLEX): HIV Screen 4th Generation wRfx: NONREACTIVE

## 2020-08-10 LAB — HEPATITIS C ANTIBODY: Hep C Virus Ab: <0.1 s/co ratio (ref 0.0–0.9)

## 2020-08-10 LAB — TSH: TSH: 0.686 u[IU]/mL (ref 0.450–4.500)

## 2020-08-10 LAB — RPR: RPR Ser Ql: NONREACTIVE

## 2020-08-15 NOTE — Progress Notes (Signed)
She did not look at her [employer]. Please contact her. Thanks.

## 2020-09-26 ENCOUNTER — Other Ambulatory Visit: Payer: Self-pay

## 2020-09-26 ENCOUNTER — Ambulatory Visit (AMBULATORY_SURGERY_CENTER): Payer: Self-pay

## 2020-09-26 VITALS — Ht 66.5 in | Wt 155.0 lb

## 2020-09-26 DIAGNOSIS — Z1211 Encounter for screening for malignant neoplasm of colon: Secondary | ICD-10-CM

## 2020-09-26 MED ORDER — GOLYTELY 236 G PO SOLR: 4000.0000 mL | ORAL | 0 refills | Status: DC

## 2020-09-26 NOTE — Progress Notes (Signed)
No egg or soy allergy known to patient  No issues with past sedation with any surgeries or procedures Patient denies ever being told they had issues or difficulty with intubation  No FH of Malignant Hyperthermia No diet pills per patient No home 02 use per patient  No blood thinners per patient  Pt denies issues with constipation  No A fib or A flutter  EMMI video via MyChart  COVID 19 guidelines implemented in PV today with Pt and RN  Pt is fully vaccinated for Covid x 2 + booster; NO PA's for preps discussed with pt in PV today  Discussed with pt there will be an out-of-pocket cost for prep and that varies from $0 to 70 dollars  Due to the COVID-19 pandemic we are asking patients to follow certain guidelines.  Pt aware of COVID protocols and [facility] guidelines

## 2020-10-05 ENCOUNTER — Encounter: Payer: Self-pay | Admitting: Internal Medicine

## 2020-10-10 ENCOUNTER — Other Ambulatory Visit: Payer: Self-pay

## 2020-10-10 ENCOUNTER — Ambulatory Visit (AMBULATORY_SURGERY_CENTER): Payer: 59 | Admitting: Gastroenterology

## 2020-10-10 ENCOUNTER — Encounter: Payer: Self-pay | Admitting: Gastroenterology

## 2020-10-10 VITALS — BP 121/71 | HR 61 | Temp 97.1°F | Resp 17 | Ht 66.5 in | Wt 155.0 lb

## 2020-10-10 DIAGNOSIS — Z1211 Encounter for screening for malignant neoplasm of colon: Secondary | ICD-10-CM

## 2020-10-10 DIAGNOSIS — K635 Polyp of colon: Secondary | ICD-10-CM

## 2020-10-10 DIAGNOSIS — D124 Benign neoplasm of descending colon: Secondary | ICD-10-CM

## 2020-10-10 MED ORDER — SODIUM CHLORIDE 0.9 % IV SOLN
500.0000 mL | Freq: Once | INTRAVENOUS | Status: DC
Start: 2020-10-10 — End: 2020-10-10

## 2020-10-10 NOTE — Patient Instructions (Signed)
Handout on polyps given. ° °YOU HAD AN ENDOSCOPIC PROCEDURE TODAY AT THE [facility]:   Refer to the procedure report that was given to you for any specific questions about what was found during the examination.  If the procedure report does not answer your questions, please call your gastroenterologist to clarify.  If you requested that your care partner not be given the details of your procedure findings, then the procedure report has been included in a sealed envelope for you to review at your convenience later. ° °YOU SHOULD EXPECT: Some feelings of bloating in the abdomen. Passage of more gas than usual.  Walking can help get rid of the air that was put into your GI tract during the procedure and reduce the bloating. If you had a lower endoscopy (such as a colonoscopy or flexible sigmoidoscopy) you may notice spotting of blood in your stool or on the toilet paper. If you underwent a bowel prep for your procedure, you may not have a normal bowel movement for a few days. ° °Please Note:  You might notice some irritation and congestion in your nose or some drainage.  This is from the oxygen used during your procedure.  There is no need for concern and it should clear up in a day or so. ° °SYMPTOMS TO REPORT IMMEDIATELY: ° °Following lower endoscopy (colonoscopy or flexible sigmoidoscopy): ° Excessive amounts of blood in the stool ° Significant tenderness or worsening of abdominal pains ° Swelling of the abdomen that is new, acute ° Fever of 100°F or higher ° °For urgent or emergent issues, a gastroenterologist can be reached at any hour by calling [phone]. °Do not use MyChart messaging for urgent concerns.  ° ° °DIET:  We do recommend a small meal at first, but then you may proceed to your regular diet.  Drink plenty of fluids but you should avoid alcoholic beverages for 24 hours. ° °ACTIVITY:  You should plan to take it easy for the rest of today and you should NOT DRIVE or use heavy machinery  until tomorrow (because of the sedation medicines used during the test).   ° °FOLLOW UP: °Our staff will call the number listed on your records 48-72 hours following your procedure to check on you and address any questions or concerns that you may have regarding the information given to you following your procedure. If we do not reach you, we will leave a message.  We will attempt to reach you two times.  During this call, we will ask if you have developed any symptoms of COVID 19. If you develop any symptoms (ie: fever, flu-like symptoms, shortness of breath, cough etc.) before then, please call [phone].  If you test positive for Covid 19 in the 2 weeks post procedure, please call and report this information to us.   ° °If any biopsies were taken you will be contacted by phone or by letter within the next 1-3 weeks.  Please call us at [phone] if you have not heard about the biopsies in 3 weeks.  ° ° °SIGNATURES/CONFIDENTIALITY: °You and/or your care partner have signed paperwork which will be entered into your electronic medical record.  These signatures attest to the fact that that the information above on your After Visit Summary has been reviewed and is understood.  Full responsibility of the confidentiality of this discharge information lies with you and/or your care-partner.  °

## 2020-10-10 NOTE — Op Note (Signed)
[facility] Patient Name: Mylinda Brook Procedure Date: 10/10/2020 9:26 AM MRN: [id number] Endoscopist: Milus Banister , MD Age: 55 Referring MD:  Date of Birth: [birth year] Gender: Female Account #: [id number] Procedure:                Colonoscopy Indications:              Screening for colorectal malignant neoplasm Medicines:                Monitored Anesthesia Care Procedure:                Pre-Anesthesia Assessment:                           - Prior to the procedure, a History and Physical                            was performed, and patient medications and                            allergies were reviewed. The patient's tolerance of                            previous anesthesia was also reviewed. The risks                            and benefits of the procedure and the sedation                            options and risks were discussed with the patient.                            All questions were answered, and informed consent                            was obtained. Prior Anticoagulants: The patient has                            taken no previous anticoagulant or antiplatelet                            agents. ASA Grade Assessment: II - A patient with                            mild systemic disease. After reviewing the risks                            and benefits, the patient was deemed in                            satisfactory condition to undergo the procedure.                           After obtaining informed consent, the colonoscope  was passed under direct vision. Throughout the                            procedure, the patient's blood pressure, pulse, and                            oxygen saturations were monitored continuously. The                            Olympus [id number]) [id number] was introduced                            through the anus and advanced to the the cecum,                            identified by  appendiceal orifice and ileocecal                            valve. The colonoscopy was performed without                            difficulty. The patient tolerated the procedure                            well. The quality of the bowel preparation was                            good. The ileocecal valve, appendiceal orifice, and                            rectum were photographed. Scope In: 9:28:17 AM Scope Out: 9:40:54 AM Scope Withdrawal Time: 0 hours 9 minutes 26 seconds  Total Procedure Duration: 0 hours 12 minutes 37 seconds  Findings:                 A 1 mm polyp was found in the descending colon. The                            polyp was sessile. The polyp was removed with a                            cold biopsy forceps. Resection and retrieval were                            complete.                           The exam was otherwise without abnormality on                            direct and retroflexion views. Complications:            No immediate complications. Estimated blood loss:  None. Estimated Blood Loss:     Estimated blood loss: none. Impression:               - One 1 mm polyp in the descending colon, removed                            with a cold biopsy forceps. Resected and retrieved.                           - The examination was otherwise normal on direct                            and retroflexion views. Recommendation:           - Patient has a contact number available for                            emergencies. The signs and symptoms of potential                            delayed complications were discussed with the                            patient. Return to normal activities tomorrow.                            Written discharge instructions were provided to the                            patient.                           - Resume previous diet.                           - Continue present medications.                            - Await pathology results. Milus Banister, MD 10/10/2020 9:42:53 AM This report has been signed electronically.

## 2020-10-10 NOTE — Progress Notes (Signed)
Called to room to assist during endoscopic procedure.  Patient ID and intended procedure confirmed with present staff. Received instructions for my participation in the procedure from the performing physician.  

## 2020-10-10 NOTE — Progress Notes (Signed)
VS-CW  Pt's states no medical or surgical changes since previsit or office visit.  

## 2020-10-10 NOTE — Progress Notes (Signed)
PT taken to PACU. Monitors in place. VSS. Report given to RN. 

## 2020-10-15 ENCOUNTER — Telehealth: Payer: Self-pay

## 2020-10-15 NOTE — Telephone Encounter (Signed)
  Follow up Call-  Call back number 10/10/2020  Post procedure Call Back phone  # [phone]  Permission to leave phone message Yes  Some recent data might be hidden     Patient questions:  Do you have a fever, pain , or abdominal swelling? No. Pain Score  0 *  Have you tolerated food without any problems? Yes.    Have you been able to return to your normal activities? Yes.    Do you have any questions about your discharge instructions: Diet   No. Medications  No. Follow up visit  No.  Do you have questions or concerns about your Care? No.  Actions: * If pain score is 4 or above: No action needed, pain <4.  1. Have you developed a fever since your procedure? no  2.   Have you had an respiratory symptoms (SOB or cough) since your procedure? no  3.   Have you tested positive for COVID 19 since your procedure no  4.   Have you had any family members/close contacts diagnosed with the COVID 19 since your procedure?  no   If yes to any of these questions please route to Joylene John, RN and Joella Prince, RN

## 2020-10-19 ENCOUNTER — Encounter: Payer: Self-pay | Admitting: Gastroenterology

## 2020-10-29 ENCOUNTER — Ambulatory Visit (INDEPENDENT_AMBULATORY_CARE_PROVIDER_SITE_OTHER): Payer: 59 | Admitting: Family Medicine

## 2020-10-29 ENCOUNTER — Other Ambulatory Visit: Payer: Self-pay

## 2020-10-29 ENCOUNTER — Encounter: Payer: Self-pay | Admitting: Family Medicine

## 2020-10-29 VITALS — BP 120/80 | HR 81 | Wt 154.0 lb

## 2020-10-29 DIAGNOSIS — Z01419 Encounter for gynecological examination (general) (routine) without abnormal findings: Secondary | ICD-10-CM | POA: Diagnosis not present

## 2020-10-29 DIAGNOSIS — N951 Menopausal and female climacteric states: Secondary | ICD-10-CM | POA: Diagnosis not present

## 2020-10-29 DIAGNOSIS — Z90710 Acquired absence of both cervix and uterus: Secondary | ICD-10-CM

## 2020-10-29 NOTE — Progress Notes (Signed)
   Subjective:    Patient ID: Helen Walker, female    DOB: 09/13/1965, 55 y.o.   MRN: [id number]  HPI Chief Complaint  Patient presents with  . Gynecologic Exam    Pap smear , having hot flashes, had hysterectomy but not sure if cervix is still in   Here for a pelvic exam and questions whether she may need a pap smear.   Review of her chart shows a total abdominal hysterectomy in 2016 with Dr. Toney Rakes.   Complains of hot flashes, having 1 almost hourly during the day and waking up at night. States she tried black cohosh and did not like how she felt.  She is not currently in favor of hormone therapy but will consider it. States she is aware of the triggers for hot flashes for her including wine.  Denies fever, chills, dizziness, chest pain, palpitations, shortness of breath, abdominal pain, N/V/D, urinary symptoms, LE edema.   Has a boyfriend. Reports decreased libido, no dyspareunia.   Reviewed allergies, medications, past medical, surgical, family, and social history.    Review of Systems Pertinent positives and negatives in the history of present illness.     Objective:   Physical Exam BP 120/80   Pulse 81   Wt 154 lb (69.9 kg)   LMP 11/16/2014 (Approximate)   BMI 24.48 kg/m   Alert and oriented in no acute distress.  Respirations unlabored.  Speculum exam done and no abnormalities noted.  Chaperone present.      Assessment & Plan:  Hot flashes due to menopause  History of total abdominal hysterectomy  Encounter for gynecological examination without abnormal finding  She is here today in her usual state of health for a pelvic exam. Exam unremarkable. She will continue to avoid triggers for her hot flashes and let me know if she decides she would like to start on HRT.

## 2020-11-14 ENCOUNTER — Telehealth (INDEPENDENT_AMBULATORY_CARE_PROVIDER_SITE_OTHER): Payer: 59 | Admitting: Medical

## 2020-11-14 ENCOUNTER — Other Ambulatory Visit: Payer: Self-pay

## 2020-11-14 ENCOUNTER — Other Ambulatory Visit (INDEPENDENT_AMBULATORY_CARE_PROVIDER_SITE_OTHER): Payer: 59

## 2020-11-14 ENCOUNTER — Encounter: Payer: Self-pay | Admitting: Medical

## 2020-11-14 VITALS — Ht 67.0 in | Wt 155.0 lb

## 2020-11-14 DIAGNOSIS — Z20822 Contact with and (suspected) exposure to covid-19: Secondary | ICD-10-CM

## 2020-11-14 DIAGNOSIS — H938X3 Other specified disorders of ear, bilateral: Secondary | ICD-10-CM | POA: Diagnosis not present

## 2020-11-14 LAB — POC COVID19 BINAXNOW: SARS Coronavirus 2 Ag: NEGATIVE

## 2020-11-14 MED ORDER — EMERGEN-C IMMUNE PLUS PO PACK: 1.0000 | PACK | Freq: Two times a day (BID) | ORAL | 0 refills | Status: DC

## 2020-11-14 NOTE — Progress Notes (Signed)
Subjective:     Patient ID: Helen Walker, female   DOB: [birth year], 55 y.o.   MRN: [id number]  This visit type was conducted due to national recommendations for restrictions regarding the COVID-19 Pandemic (e.g. social distancing) in an effort to limit this patient's exposure and mitigate transmission in our community.  Due to their co-morbid illnesses, this patient is at least at moderate risk for complications without adequate follow up.  This format is felt to be most appropriate for this patient at this time.    Documentation for virtual audio and video telecommunications through [place] encounter:  The patient was located at home. The provider was located in the office. The patient did consent to this visit and is aware of possible charges through their insurance for this visit.  The other persons participating in this telemedicine service were none. Time spent on call was 20 minutes and in review of previous records 20 minutes total.  This virtual service is not related to other E/M service within previous 7 days.   HPI Chief Complaint  Patient presents with  . Ear Pain    Left Ear pain with covid exposure-symptoms started Monday    Virtual consult today for COVID exposure.  She was at a fitness retreat over the weekend and found out on Monday that one of the participants had COVID that she sat across from at dinner.  At that time no one had symptoms.  Her last contact with this person was 4 to 5 days ago .  She notes having some ear discomfort and feels little stuffy in the head, but no other symptoms. No fever, no cough, no nausea, no vomiting, no diarrhea.  No SOB or wheezing.  No body aches or chills.   Feels just stuffy in nose and ears muffled.  No itchy eyes, no sneezing.  She has not done a covid test.  Using nothing for ear stuffy symptoms.   Otherwise normal state of health.  She notes having COVID in the past with minimal symptoms including ear pressure so she was  concerned about COVID.  She wants to have the test since she is around her mom who is sickly.  She is quarantining currently wearing mask  Past Medical History:  Diagnosis Date  . Bladder mass 2013  . Medical history non-contributory    Current Outpatient Medications on File Prior to Visit  Medication Sig Dispense Refill  . acetaminophen (TYLENOL) 500 MG tablet Take 500 mg by mouth every 6 (six) hours as needed.    . naproxen (NAPROSYN) 500 MG tablet Take 1 tablet (500 mg total) by mouth 2 (two) times daily. 30 tablet 0  . valACYclovir (VALTREX) 500 MG tablet Take 1 tablet (500 mg total) by mouth 2 (two) times daily. X 3 days for outbreaks. (Patient not taking: No sig reported) 30 tablet 1   No current facility-administered medications on file prior to visit.    Review of Systems As in subjective    Objective:   Physical Exam Due to coronavirus pandemic stay at home measures, patient visit was virtual and they were not examined in person.   General well-developed well-nourished no acute distress No dyspnea, no labored breathing Well-appearing     Assessment:     Encounter Diagnoses  Name Primary?  . Pressure sensation in both ears Yes  . Close exposure to COVID-19 virus        Plan:     She will come in for COVID testing this  morning. She has very little symptoms currently and recent exposure.  Other than ear pressure she is about 4-1/2 to 5 days out from exposure without other significant symptoms. Advise rest, begin emergenC immune plus vitamin pack, can use Tylenol for body aches or chills or fever, can use Mucinex DM if she develops cough and congestion She will begin allergy medicine she has at home currently, Benadryl If new or worse symptoms over the next few days call back If COVID test comes back negative she will wear a mask for the next 5 days and watch her symptoms but otherwise can be back in the general public  Helen Walker was seen today for ear  pain.  Diagnoses and all orders for this visit:  Pressure sensation in both ears  Close exposure to COVID-19 virus  Other orders -     Multiple Vitamins-Minerals (EMERGEN-C IMMUNE PLUS) PACK; Take 1 tablet by mouth 2 (two) times daily.  f/u in our parking lot this morning for covid testing.

## 2020-11-15 LAB — NOVEL CORONAVIRUS, NAA: SARS-CoV-2, NAA: NOT DETECTED

## 2020-11-15 LAB — SARS-COV-2, NAA 2 DAY TAT

## 2021-01-31 ENCOUNTER — Encounter: Payer: Self-pay | Admitting: Family Medicine

## 2021-01-31 ENCOUNTER — Ambulatory Visit (INDEPENDENT_AMBULATORY_CARE_PROVIDER_SITE_OTHER): Payer: 59 | Admitting: Family Medicine

## 2021-01-31 ENCOUNTER — Other Ambulatory Visit: Payer: Self-pay

## 2021-01-31 VITALS — BP 100/70 | HR 88 | Ht 68.0 in | Wt 160.0 lb

## 2021-01-31 DIAGNOSIS — R42 Dizziness and giddiness: Secondary | ICD-10-CM | POA: Diagnosis not present

## 2021-01-31 MED ORDER — MECLIZINE HCL 25 MG PO TABS: 25.0000 mg | ORAL_TABLET | Freq: Three times a day (TID) | ORAL | 0 refills | Status: DC | PRN

## 2021-01-31 NOTE — Progress Notes (Signed)
Chief Complaint  Patient presents with   Dizziness    Woke up last Friday and the room was spinning. When she moves her head certain ways it worsens. Helen Walker has rx'd '25mg'$  meclizine in the past, does not have any left.    7/29 woke up at 2am with room spinning, had to hold onto walls. It lasted all day, which is longer than in the past.   She has less vertigo since she started using an extra pillow when in bed at night. She only notices slight dizziness when she turns during the day, but manageable.  Worse when she lays flat. Hasn't had any spinning since Sunday. She doesn't feel 100%, has not able to exercise, scared of it triggering the vertigo, since she still is a little dizzy.  She previously used meclizine for prior bouts of vertigo, asking for refill.  PMH, PSH, SH reviewed  Outpatient Encounter Medications as of 01/31/2021  Medication Sig   acetaminophen (TYLENOL) 500 MG tablet Take 500 mg by mouth every 6 (six) hours as needed. (Patient not taking: Reported on 01/31/2021)   naproxen (NAPROSYN) 500 MG tablet Take 1 tablet (500 mg total) by mouth 2 (two) times daily. (Patient not taking: Reported on 01/31/2021)   valACYclovir (VALTREX) 500 MG tablet Take 1 tablet (500 mg total) by mouth 2 (two) times daily. X 3 days for outbreaks. (Patient not taking: No sig reported)   [DISCONTINUED] Multiple Vitamins-Minerals (EMERGEN-C IMMUNE PLUS) PACK Take 1 tablet by mouth 2 (two) times daily.   No facility-administered encounter medications on file as of 01/31/2021.   No Known Allergies  ROS: no fever, chills, headache, lightheadedness, syncope. No URI, allergy symptoms, cough, shortness of breath. Had nausea with vertigo, never vomited. Denies any nausea currently. No diarrhea. No bleeding, bruising, rashes, or other concerns.   PHYSICAL EXAM  BP 100/70   Pulse 88   Ht '5\' 8"'$  (1.727 m)   Wt 160 lb (72.6 kg)   LMP 11/16/2014 (Approximate)   BMI 24.33 kg/m   Well-appearing, pleasant female,  in no distress HEENT: conjunctiva and sclera are clear, EOMI. Nonobstructive cerumen in both ears OP clear, moist mucus membranes. Sinuses nontender Neck: No lymphadenopathy or mass Heart: regular rate and rhythm, no murmur Lungs: clear bilaterally Extremities: no edema Neuro: alert and oriented, cranial nerves intact. Normal strength, sensation, DTR's 2+ and symmetric.  Unable to elicit any vertigo with various position changes. Normal gait, normal finger to nose. Psych: normal mood, affect, hygiene and grooming   ASSESSMENT/PLAN:  Vertigo - per history, likely positional, and has significantly improved. Ddx discussed.  RF'd meclizine to use prn. Discussed [place]/referral if BPV recurs - Plan: meclizine (ANTIVERT) 25 MG tablet

## 2021-01-31 NOTE — Patient Instructions (Signed)
I suspect that your vertigo was positional (benign positional vertigo), but has already resolved. Use the meclizine if needed if vertigo (room-spinning) recurs. It causes sedation for certain people, so use with caution if driving/working.  If it recurs and is severe (and if only with certain positions), remember we said that physical therapy could perform a maneuver to help you get better faster.  Let us know if this occurs.  Benign Positional Vertigo Vertigo is the feeling that you or your surroundings are moving when they are not. Benign positional vertigo is the most common form of vertigo. This is usually a harmless condition (benign). This condition is positional. This means that symptoms are triggered bycertain movements and positions. This condition can be dangerous if it occurs while you are doing something that could cause harm to yourself or others. This includes activities such asdriving or operating machinery. What are the causes? The inner ear has fluid-filled canals that help your brain sense movement and balance. When the fluid moves, the brain receives messages about your body'sposition. With benign positional vertigo, calcium crystals in the inner ear break free and disturb the inner ear area. This causes your brain to receive confusingmessages about your body's position. What increases the risk? You are more likely to develop this condition if: You are a woman. You are 55 years of age or older. You have recently had a head injury. You have an inner ear disease. What are the signs or symptoms? Symptoms of this condition usually happen when you move your head or your eyes in different directions. Symptoms may start suddenly and usually last for less than a minute. They include: Loss of balance and falling. Feeling like you are spinning or moving. Feeling like your surroundings are spinning or moving. Nausea and vomiting. Blurred vision. Dizziness. Involuntary eye movement  (nystagmus). Symptoms can be mild and cause only minor problems, or they can be severe and interfere with daily life. Episodes of benign positional vertigo may return (recur) over time. Symptoms may also improve over time. How is this diagnosed? This condition may be diagnosed based on: Your medical history. A physical exam of the head, neck, and ears. Positional tests to check for or stimulate vertigo. You may be asked to turn your head and change positions, such as going from sitting to lying down. A health care provider will watch for symptoms of vertigo. You may be referred to a health care provider who specializes in ear, nose, and throat problems (ENT or otolaryngologist) or a provider who specializes in disorders of the nervous system (neurologist). How is this treated?  This condition may be treated in a session in which your health care provider moves your head in specific positions to help the displaced crystals in your inner ear move. Treatment for this condition may take several sessions. Surgerymay be needed in severe cases, but this is rare. In some cases, benign positional vertigo may resolve on its own in 2-4 weeks. Follow these instructions at home: Safety Move slowly. Avoid sudden body or head movements or certain positions, as told by your health care provider. Avoid driving or operating machinery until your health care provider says it is safe. Avoid doing any tasks that would be dangerous to you or others if vertigo occurs. If you have trouble walking or keeping your balance, try using a cane for stability. If you feel dizzy or unstable, sit down right away. Return to your normal activities as told by your health care provider. Ask your  health care provider what activities are safe for you. General instructions Take over-the-counter and prescription medicines only as told by your health care provider. Drink enough fluid to keep your urine pale yellow. Keep all follow-up  visits. This is important. Contact a health care provider if: You have a fever. Your condition gets worse or you develop new symptoms. Your family or friends notice any behavioral changes. You have nausea or vomiting that gets worse. You have numbness or a prickling and tingling sensation. Get help right away if you: Have difficulty speaking or moving. Are always dizzy or faint. Develop severe headaches. Have weakness in your legs or arms. Have changes in your hearing or vision. Develop a stiff neck. Develop sensitivity to light. These symptoms may represent a serious problem that is an emergency. Do not wait to see if the symptoms will go away. Get medical help right away. Call your local emergency services (911 in the [place] Do not drive yourself to the hospital. Summary Vertigo is the feeling that you or your surroundings are moving when they are not. Benign positional vertigo is the most common form of vertigo. This condition is caused by calcium crystals in the inner ear that become displaced. This causes a disturbance in an area of the inner ear that helps your brain sense movement and balance. Symptoms include loss of balance and falling, feeling that you or your surroundings are moving, nausea and vomiting, and blurred vision. This condition can be diagnosed based on symptoms, a physical exam, and positional tests. Follow safety instructions as told by your health care provider and keep all follow-up visits. This is important. This information is not intended to replace advice given to you by your health care provider. Make sure you discuss any questions you have with your healthcare provider. Document Revised: 05/16/2020 Document Reviewed: 05/16/2020 Elsevier Patient Education  2022 [employer].

## 2021-04-15 ENCOUNTER — Ambulatory Visit (INDEPENDENT_AMBULATORY_CARE_PROVIDER_SITE_OTHER): Payer: 59 | Admitting: Medical

## 2021-04-15 ENCOUNTER — Other Ambulatory Visit: Payer: Self-pay

## 2021-04-15 VITALS — BP 120/80 | HR 77 | Wt 159.2 lb

## 2021-04-15 DIAGNOSIS — R350 Frequency of micturition: Secondary | ICD-10-CM | POA: Diagnosis not present

## 2021-04-15 DIAGNOSIS — R300 Dysuria: Secondary | ICD-10-CM | POA: Insufficient documentation

## 2021-04-15 HISTORY — DX: Frequency of micturition: R35.0

## 2021-04-15 HISTORY — DX: Dysuria: R30.0

## 2021-04-15 LAB — POCT URINALYSIS DIP (PROADVANTAGE DEVICE)
Bilirubin, UA: NEGATIVE
Blood, UA: NEGATIVE
Glucose, UA: NEGATIVE mg/dL
Ketones, POC UA: NEGATIVE mg/dL
Leukocytes, UA: NEGATIVE
Nitrite, UA: NEGATIVE
Specific Gravity, Urine: 1.015
Urobilinogen, Ur: NEGATIVE
pH, UA: 7 (ref 5.0–8.0)

## 2021-04-15 MED ORDER — SULFAMETHOXAZOLE-TRIMETHOPRIM 800-160 MG PO TABS: 1.0000 | ORAL_TABLET | Freq: Two times a day (BID) | ORAL | 0 refills | Status: DC

## 2021-04-15 NOTE — Progress Notes (Signed)
Subjective:   Helen Walker is a 55 y.o. female who complains of possible urinary tract infection.  They report symptoms for 3 days.  Symptoms include urinary pressure, urine frequency.  Denies blood, no burning, no fever, no NVD.  No vaginal discharge.  Using nothing for current symptoms.   Patient does not have a history of pyelonephritis.    No other aggravating or relieving factors.  No other c/o.  Past Medical History:  Diagnosis Date   Bladder mass 2013   Medical history non-contributory     Current Outpatient Medications on File Prior to Visit  Medication Sig Dispense Refill   acetaminophen (TYLENOL) 500 MG tablet Take 500 mg by mouth every 6 (six) hours as needed.     meclizine (ANTIVERT) 25 MG tablet Take 1 tablet (25 mg total) by mouth 3 (three) times daily as needed for dizziness. 30 tablet 0   valACYclovir (VALTREX) 500 MG tablet Take 1 tablet (500 mg total) by mouth 2 (two) times daily. X 3 days for outbreaks. 30 tablet 1   No current facility-administered medications on file prior to visit.    ROS as in subjective  Reviewed allergies, medications, past medical, surgical, and social history.     Objective: BP 120/80   Pulse 77   Wt 159 lb 3.2 oz (72.2 kg)   LMP 11/16/2014 (Approximate)   BMI 24.21 kg/m   General appearance: alert, no distress, WD/WN, female Abdomen: +bs, soft, mild suprapubic tenderness, otherwise non tender, non distended, no masses, no hepatomegaly, no splenomegaly, no bruits Back: no CVA tenderness GU: deferred       Assessment: Encounter Diagnoses  Name Primary?   Dysuria Yes   Urinary frequency      Plan: Discussed symptoms, differential. Suspect UTI.  She wants to treat for possible UTI as she has history of this.   No other aggravating factor to suggest other diagnosis.  Begin bactrim,hydrate well.   Can use Tylenol for pain prn.  Urine culture sent.  Call or return if worse or not improving in the next 3-4 days.

## 2021-04-16 ENCOUNTER — Other Ambulatory Visit: Payer: Self-pay | Admitting: Family Medicine

## 2021-04-18 LAB — URINE CULTURE

## 2021-05-27 ENCOUNTER — Other Ambulatory Visit: Payer: Self-pay

## 2021-05-27 ENCOUNTER — Other Ambulatory Visit (INDEPENDENT_AMBULATORY_CARE_PROVIDER_SITE_OTHER): Payer: 59

## 2021-05-27 DIAGNOSIS — Z23 Encounter for immunization: Secondary | ICD-10-CM | POA: Diagnosis not present

## 2021-05-29 ENCOUNTER — Ambulatory Visit (INDEPENDENT_AMBULATORY_CARE_PROVIDER_SITE_OTHER): Payer: 59 | Admitting: Medical

## 2021-05-29 ENCOUNTER — Encounter: Payer: Self-pay | Admitting: Medical

## 2021-05-29 ENCOUNTER — Other Ambulatory Visit: Payer: Self-pay

## 2021-05-29 VITALS — BP 120/80 | HR 72 | Temp 97.2°F | Wt 160.2 lb

## 2021-05-29 DIAGNOSIS — M545 Low back pain, unspecified: Secondary | ICD-10-CM | POA: Diagnosis not present

## 2021-05-29 LAB — POCT URINALYSIS DIP (PROADVANTAGE DEVICE)
Bilirubin, UA: NEGATIVE
Glucose, UA: NEGATIVE mg/dL
Ketones, POC UA: NEGATIVE mg/dL
Leukocytes, UA: NEGATIVE
Nitrite, UA: NEGATIVE
Protein Ur, POC: NEGATIVE mg/dL
Specific Gravity, Urine: 1.03
Urobilinogen, Ur: NEGATIVE
pH, UA: 6 (ref 5.0–8.0)

## 2021-05-29 MED ORDER — TIZANIDINE HCL 4 MG PO TABS: 4.0000 mg | ORAL_TABLET | Freq: Two times a day (BID) | ORAL | 0 refills | Status: DC | PRN

## 2021-05-29 NOTE — Progress Notes (Signed)
Subjective:  Helen Walker is a 55 y.o. female who presents for Chief Complaint  Patient presents with   back pain    Back pain- lifting a lot not sure if she pulled something. Had flu shot 2 days ago     Here for pains in back started 2am this morning.  She notes mid to low left back pain.  No fall, no injury, no trauma.   [place] and [place] suitcases and bags onto the table or conveyor.  Hurts to take a deep breath.   She notes having flu vaccine 2 days ago, wonders if symptoms related to this.    No recent URI symptoms.   No blood in urine, no dysuria, no urine urgency or frequency.  No hx/o kidney stone.   No bowel issues.  Has slight headache.  No hx/o recurrent uti.     Also exercises with some weights and walking.  Stretches regularly.  No other aggravating or relieving factors.    No other c/o.   Current Outpatient Medications on File Prior to Visit  Medication Sig Dispense Refill   acetaminophen (TYLENOL) 500 MG tablet Take 500 mg by mouth every 6 (six) hours as needed.     meclizine (ANTIVERT) 25 MG tablet Take 1 tablet (25 mg total) by mouth 3 (three) times daily as needed for dizziness. 30 tablet 0   valACYclovir (VALTREX) 500 MG tablet Take 1 tablet (500 mg total) by mouth 2 (two) times daily. X 3 days for outbreaks. 30 tablet 1   No current facility-administered medications on file prior to visit.   Past Surgical History:  Procedure Laterality Date   APPENDECTOMY N/A 01/23/2015   Procedure: APPENDECTOMY;  Surgeon: Terrance Mass, MD;  Location: [facility];  Service: Gynecology;  Laterality: N/A;   [place]  07/11/2011   Procedure: CYSTOSCOPY;  Surgeon: Hanley Ben, MD;  Location: [facility];  Service: Urology;  Laterality: N/A;  [facility] Bladder Mass   CYSTOSCOPY W/ RETROGRADES  07/11/2011   Procedure: CYSTOSCOPY WITH RETROGRADE PYELOGRAM;  Surgeon: Hanley Ben, MD;  Location: [facility];  Service: Urology;  Laterality: Bilateral;  retrograde of bladder   HYSTEROSCOPY W/ ENDOMETRIAL ABLATION  07-17-2006   LAPAROSCOPIC ASSISTED VAGINAL HYSTERECTOMY N/A 01/23/2015   Procedure:  total abdominal hysterectomy , right salpingectomy oophorectomy;  Surgeon: Terrance Mass, MD;  Location: [facility];  Service: Gynecology;  Laterality: N/A;  request to follow first case  requests 2 1/2 hours OR time.  needs cystoscope and harmonic scalpel   LAPAROSCOPIC LYSIS OF ADHESIONS N/A 01/23/2015   Procedure: LAPAROSCOPIC LYSIS OF ADHESIONS;  Surgeon: Terrance Mass, MD;  Location: [facility];  Service: Gynecology;  Laterality: N/A;   TONSILLECTOMY     TOTAL ABDOMINAL HYSTERECTOMY     TRANSURETHRAL RESECTION OF BLADDER TUMOR  07/11/2011   Procedure: TRANSURETHRAL RESECTION OF BLADDER TUMOR (TURBT);  Surgeon: Hanley Ben, MD;  Location: [facility];  Service: Urology;  Laterality: N/A;   TUBAL LIGATION  1995   WISDOM TOOTH EXTRACTION       The following portions of the patient's history were reviewed and updated as appropriate: allergies, current medications, past family history, past medical history, past social history, past surgical history and problem list.  ROS Otherwise as in subjective above    Objective: BP 120/80   Pulse 72   Temp (!) 97.2 F (  36.2 C)   Wt 160 lb 3.2 oz (72.7 kg)   LMP 11/16/2014 (Approximate)   BMI 24.36 kg/m    Wt Readings from Last 3 Encounters:  05/29/21 160 lb 3.2 oz (72.7 kg)  04/15/21 159 lb 3.2 oz (72.2 kg)  01/31/21 160 lb (72.6 kg)    General appearance: alert, no distress, well developed, well nourished Abdomen: +bs, soft, mild left sided tendnerss, otherwise non tender, non distended, no masses, no hepatomegaly, no splenomegaly Back: Mild paraspinal tenderness over the left mid to lower back, scoliosis noted mild, otherwise nontender.  Range of motion is about 80% of normal but she is tender in the left low back  with range of motion Legs nontender, normal ROM, no swelling or deformity Pulses: 2+ radial pulses, 2+ pedal pulses, normal cap refill Ext: no edema   Assessment: Encounter Diagnosis  Name Primary?   Acute left-sided low back pain without sciatica Yes     Plan: We discussed possible causes.  I suspect she may have just pulled a muscle in her back/muscle strain and spasm given that she [place] airport.  She did have a flu shot recently but her symptoms do not suggest generalized body aches but more focal in her left low back.    We will check a urine today to help evaluate symptoms  Advised stretching, heat, avoid heavy lifting the next few days, can use over-the-counter Aleve.  She can use short term muscle relaxer as well. Caution on sedation.  If not much better within a week then call back.  If new symptoms in the meantime call back.  Helen Walker was seen today for back pain.  Diagnoses and all orders for this visit:  Acute left-sided low back pain without sciatica -     POCT Urinalysis DIP (Proadvantage Device) -     Urinalysis, microscopic only  Other orders -     tiZANidine (ZANAFLEX) 4 MG tablet; Take 1 tablet (4 mg total) by mouth 2 (two) times daily as needed for muscle spasms.   Follow up: prn

## 2021-05-30 ENCOUNTER — Other Ambulatory Visit: Payer: Self-pay | Admitting: Medical

## 2021-05-30 LAB — URINALYSIS, MICROSCOPIC ONLY
Casts: NONE SEEN /lpf
Epithelial Cells (non renal): >10 /hpf — AB (ref 0–10)
WBC, UA: NONE SEEN /hpf (ref 0–5)

## 2021-06-17 ENCOUNTER — Encounter: Payer: Self-pay | Admitting: Family Medicine

## 2021-06-17 ENCOUNTER — Encounter: Payer: Self-pay | Admitting: Physician Assistant

## 2021-06-17 ENCOUNTER — Telehealth (INDEPENDENT_AMBULATORY_CARE_PROVIDER_SITE_OTHER): Payer: 59 | Admitting: Family Medicine

## 2021-06-17 VITALS — Ht 68.0 in | Wt 160.0 lb

## 2021-06-17 DIAGNOSIS — B349 Viral infection, unspecified: Secondary | ICD-10-CM

## 2021-06-17 NOTE — Progress Notes (Signed)
° °  Subjective:    Patient ID: Helen Walker, female    DOB: 1965-12-27, 55 y.o.   MRN: [id number]  HPI Documentation for virtual audio and video telecommunications through [place] encounter: The patient was located at home. 2 patient identifiers used.  The provider was located in the office. The patient did consent to this visit and is aware of possible charges through their insurance for this visit. The other persons participating in this telemedicine service were none. Time spent on call was 5 minutes and in review of previous records >17 minutes total for counseling and coordination of care. This virtual service is not related to other E/M service within previous 7 days.  She states that yesterday she developed a sore throat, rhinorrhea, sneezing, cough and sinus congestion.  The symptoms continued today but she has had no fever, chills, smell or taste change.  She COVID tested this morning and it was negative.  Review of Systems     Objective:   Physical Exam Alert and in no distress otherwise not examined       Assessment & Plan:  Viral syndrome Recommend that she retest tomorrow to be sure its not COVID.  Symptomatic care with gargling, Tylenol, anti-inflammatory of choice.  She is to call back with the results of the COVID test tomorrow.  Discussed mainly treating of symptoms and lessens COVID and then I will give her more instructions on proper care.  She was comfortable with that.

## 2021-07-03 ENCOUNTER — Other Ambulatory Visit: Payer: Self-pay | Admitting: Physician Assistant

## 2021-07-03 DIAGNOSIS — Z1231 Encounter for screening mammogram for malignant neoplasm of breast: Secondary | ICD-10-CM

## 2021-07-04 ENCOUNTER — Ambulatory Visit
Admission: RE | Admit: 2021-07-04 | Discharge: 2021-07-04 | Disposition: A | Discharge status: Home / Self Care | Payer: BC Managed Care – PPO | Source: Ambulatory Visit | Attending: Physician Assistant | Admitting: Physician Assistant

## 2021-07-04 DIAGNOSIS — Z1231 Encounter for screening mammogram for malignant neoplasm of breast: Secondary | ICD-10-CM

## 2021-08-12 ENCOUNTER — Encounter: Payer: Self-pay | Admitting: Physician Assistant

## 2021-08-12 ENCOUNTER — Ambulatory Visit (INDEPENDENT_AMBULATORY_CARE_PROVIDER_SITE_OTHER): Payer: BC Managed Care – PPO | Admitting: Physician Assistant

## 2021-08-12 VITALS — BP 112/78 | HR 88 | Resp 20 | Ht 68.0 in | Wt 159.6 lb

## 2021-08-12 DIAGNOSIS — M62838 Other muscle spasm: Secondary | ICD-10-CM

## 2021-08-12 DIAGNOSIS — Z23 Encounter for immunization: Secondary | ICD-10-CM | POA: Diagnosis not present

## 2021-08-12 DIAGNOSIS — Z9109 Other allergy status, other than to drugs and biological substances: Secondary | ICD-10-CM

## 2021-08-12 DIAGNOSIS — R7309 Other abnormal glucose: Secondary | ICD-10-CM | POA: Insufficient documentation

## 2021-08-12 DIAGNOSIS — Z0000 Encounter for general adult medical examination without abnormal findings: Secondary | ICD-10-CM

## 2021-08-12 DIAGNOSIS — R7303 Prediabetes: Secondary | ICD-10-CM

## 2021-08-12 DIAGNOSIS — M545 Low back pain, unspecified: Secondary | ICD-10-CM

## 2021-08-12 DIAGNOSIS — G8929 Other chronic pain: Secondary | ICD-10-CM

## 2021-08-12 HISTORY — DX: Other muscle spasm: M62.838

## 2021-08-12 LAB — CBC WITH DIFFERENTIAL/PLATELET

## 2021-08-12 MED ORDER — TIZANIDINE HCL 4 MG PO TABS: 4.0000 mg | ORAL_TABLET | Freq: Every day | ORAL | 0 refills | Status: DC

## 2021-08-12 NOTE — Progress Notes (Signed)
Established Patient Office Visit  Subjective:  Patient ID: Helen Walker, female    DOB: 06-Sep-1965  Age: 56 y.o. MRN: [id number]  CC:  Chief Complaint  Patient presents with   Annual Exam     HPI Helen Walker presents for joint pain, allergy referral; Reports ongoing low back pain and left shoulder pain that patient attributes to perimenopause; states she is still able to work out 3 - 4 days a week; drinks 64 ounces of water a day; denies accident or injury; OTC Tylenol helps as needed; declines to have an x-ray or referral to Orthopedics; requests a refill of muscle relaxers; also requests a referral to an Allergist to see exactly what she is allergic to.  Past Medical History:  Diagnosis Date   Bladder mass 2013   Dysuria 04/15/2021   Encounter for postoperative care 01/23/2015   Medical history non-contributory    Urinary frequency 04/15/2021    Past Surgical History:  Procedure Laterality Date   APPENDECTOMY N/A 01/23/2015   Procedure: APPENDECTOMY;  Surgeon: Terrance Mass, MD;  Location: [facility];  Service: Gynecology;  Laterality: N/A;   [place]  07/11/2011   Procedure: CYSTOSCOPY;  Surgeon: Hanley Ben, MD;  Location: [facility];  Service: Urology;  Laterality: N/A;  [facility] Bladder Mass   CYSTOSCOPY W/ RETROGRADES  07/11/2011   Procedure: CYSTOSCOPY WITH RETROGRADE PYELOGRAM;  Surgeon: Hanley Ben, MD;  Location: [facility];  Service: Urology;  Laterality: Bilateral;  retrograde of bladder   HYSTEROSCOPY W/ ENDOMETRIAL ABLATION  07-17-2006   LAPAROSCOPIC ASSISTED VAGINAL HYSTERECTOMY N/A 01/23/2015   Procedure:  total abdominal hysterectomy , right salpingectomy oophorectomy;  Surgeon: Terrance Mass, MD;  Location: [facility];  Service: Gynecology;  Laterality: N/A;  request to follow first case  requests 2 1/2 hours OR time.  needs cystoscope and harmonic scalpel   LAPAROSCOPIC LYSIS OF  ADHESIONS N/A 01/23/2015   Procedure: LAPAROSCOPIC LYSIS OF ADHESIONS;  Surgeon: Terrance Mass, MD;  Location: [facility];  Service: Gynecology;  Laterality: N/A;   TONSILLECTOMY     TOTAL ABDOMINAL HYSTERECTOMY     TRANSURETHRAL RESECTION OF BLADDER TUMOR  07/11/2011   Procedure: TRANSURETHRAL RESECTION OF BLADDER TUMOR (TURBT);  Surgeon: Hanley Ben, MD;  Location: [facility];  Service: Urology;  Laterality: N/A;   TUBAL LIGATION  1995   WISDOM TOOTH EXTRACTION      Family History  Problem Relation Age of Onset   Hypertension Mother    Diabetes Mother    Breast cancer Mother 38   Kidney disease Paternal Grandmother    Colon polyps Neg Hx    Colon cancer Neg Hx    Esophageal cancer Neg Hx    Rectal cancer Neg Hx    Stomach cancer Neg Hx     Social History   Socioeconomic History   Marital status: Single    Spouse name: Not on file   Number of children: Not on file   Years of education: Not on file   Highest education level: Not on file  Occupational History   Not on file  Tobacco Use   Smoking status: Never    Passive exposure: Current   Smokeless tobacco: Never  Vaping Use   Vaping Use: Never used  Substance and Sexual Activity   Alcohol use: Yes    Alcohol/week: 0.0 standard drinks   Drug use: No   Sexual  activity: Yes    Partners: Male    Birth control/protection: Surgical  Other Topics Concern   Not on file  Social History Narrative   Not on file   Social Determinants of Health   Financial Resource Strain: Not on file  Food Insecurity: Not on file  Transportation Needs: Not on file  Physical Activity: Not on file  Stress: Not on file  Social Connections: Not on file  Intimate Partner Violence: Not on file    Outpatient Medications Prior to Visit  Medication Sig Dispense Refill   acetaminophen (TYLENOL) 500 MG tablet Take 500 mg by mouth every 6 (six) hours as needed.     meclizine (ANTIVERT) 25 MG tablet Take 1 tablet (25 mg  total) by mouth 3 (three) times daily as needed for dizziness. 30 tablet 0   valACYclovir (VALTREX) 500 MG tablet Take 1 tablet (500 mg total) by mouth 2 (two) times daily. X 3 days for outbreaks. 30 tablet 1   tiZANidine (ZANAFLEX) 4 MG tablet Take 1 tablet (4 mg total) by mouth 2 (two) times daily as needed for muscle spasms. 20 tablet 0   No facility-administered medications prior to visit.    No Known Allergies  ROS Review of Systems  Constitutional:  Negative for activity change and fever.  HENT:  Negative for congestion, ear pain and voice change.   Eyes:  Negative for redness.  Respiratory:  Negative for cough.   Cardiovascular:  Negative for chest pain.  Gastrointestinal:  Negative for constipation and diarrhea.  Endocrine: Negative for polyuria.  Genitourinary:  Negative for flank pain.  Musculoskeletal:  Positive for arthralgias and back pain. Negative for gait problem and neck stiffness.  Skin:  Negative for color change and rash.  Neurological:  Negative for dizziness.  Hematological:  Negative for adenopathy.  Psychiatric/Behavioral:  Negative for agitation, behavioral problems and confusion.      Objective:    Physical Exam Vitals and nursing note reviewed.  Constitutional:      General: She is not in acute distress.    Appearance: Normal appearance.  HENT:     Head: Normocephalic and atraumatic.     Right Ear: Tympanic membrane, ear canal and external ear normal.     Left Ear: Tympanic membrane, ear canal and external ear normal.  Eyes:     Conjunctiva/sclera: Conjunctivae normal.     Pupils: Pupils are equal, round, and reactive to light.  Neck:     Vascular: No carotid bruit.  Cardiovascular:     Rate and Rhythm: Normal rate and regular rhythm.     Pulses: Normal pulses.     Heart sounds: Normal heart sounds.  Pulmonary:     Effort: Pulmonary effort is normal. No respiratory distress.     Breath sounds: Normal breath sounds. No wheezing.  Abdominal:      General: Bowel sounds are normal.     Palpations: Abdomen is soft.  Musculoskeletal:        General: Normal range of motion.     Right shoulder: No swelling. Normal range of motion. Normal pulse.     Left shoulder: No swelling. Normal range of motion. Normal pulse.     Cervical back: Normal range of motion and neck supple.     Right lower leg: No edema.     Left lower leg: No edema.  Skin:    General: Skin is warm and dry.     Findings: No rash.  Neurological:  General: No focal deficit present.     Mental Status: She is alert and oriented to person, place, and time.     Gait: Gait normal.  Psychiatric:        Mood and Affect: Mood normal.        Behavior: Behavior normal.    BP 112/78 (BP Location: Left Arm, Patient Position: Sitting, Cuff Size: Normal)    Pulse 88    Resp 20    Ht 5\' 8"  (1.727 m)    Wt 159 lb 9.6 oz (72.4 kg)    LMP 11/16/2014 (Approximate)    SpO2 98%    BMI 24.27 kg/m  Wt Readings from Last 3 Encounters:  08/12/21 159 lb 9.6 oz (72.4 kg)  06/17/21 160 lb (72.6 kg)  05/29/21 160 lb 3.2 oz (72.7 kg)     There are no preventive care reminders to display for this patient.   There are no preventive care reminders to display for this patient.  Lab Results  Component Value Date   TSH 0.686 08/09/2020   Lab Results  Component Value Date   WBC 5.2 08/09/2020   HGB 13.2 08/09/2020   HCT 38.9 08/09/2020   MCV 86 08/09/2020   PLT 344 08/09/2020   Lab Results  Component Value Date   NA 140 08/09/2020   K 4.8 08/09/2020   CO2 23 08/09/2020   GLUCOSE 100 (H) 08/09/2020   BUN 18 08/09/2020   CREATININE 0.97 08/09/2020   BILITOT 0.6 08/09/2020   ALKPHOS 71 08/09/2020   AST 14 08/09/2020   ALT 12 08/09/2020   PROT 8.2 08/09/2020   ALBUMIN 4.7 08/09/2020   CALCIUM 9.7 08/09/2020   Lab Results  Component Value Date   CHOL 191 08/09/2020   Lab Results  Component Value Date   HDL 47 08/09/2020   Lab Results  Component Value Date    LDLCALC 133 (H) 08/09/2020   Lab Results  Component Value Date   TRIG 59 08/09/2020   Lab Results  Component Value Date   CHOLHDL 4.1 08/09/2020   Lab Results  Component Value Date   HGBA1C 6.0 (H) 08/09/2020      Assessment & Plan:   Problem List Items Addressed This Visit       Other   Prediabetes   Relevant Orders   Comprehensive metabolic panel   Hemoglobin A1c   Muscle spasm   Relevant Medications   tiZANidine (ZANAFLEX) 4 MG tablet   Chronic bilateral low back pain without sciatica   Relevant Medications   tiZANidine (ZANAFLEX) 4 MG tablet   Environmental allergies   Relevant Orders   Ambulatory referral to Allergy   Other Visit Diagnoses     Routine medical exam    -  Primary   Relevant Orders   CBC with Differential/Platelet   Comprehensive metabolic panel   Lipid panel   TSH + free T4   Hemoglobin A1c   Need for shingles vaccine           Meds ordered this encounter  Medications   tiZANidine (ZANAFLEX) 4 MG tablet    Sig: Take 1 tablet (4 mg total) by mouth at bedtime. Only take at bedtime as needed    Dispense:  20 tablet    Refill:  0    Order Specific Question:   Supervising Provider    Answer:   Denita Lung [[id number]]    Follow-up: Return in about 1 year (around 08/12/2022) for Return for annual  exam 45 minutes.    Irene Pap, PA-C

## 2021-08-13 LAB — COMPREHENSIVE METABOLIC PANEL
ALT: 17 IU/L (ref 0–32)
AST: 18 IU/L (ref 0–40)
Albumin/Globulin Ratio: 1.4 (ref 1.2–2.2)
Albumin: 4.4 g/dL (ref 3.8–4.9)
Alkaline Phosphatase: 76 IU/L (ref 44–121)
BUN/Creatinine Ratio: 21 (ref 9–23)
BUN: 21 mg/dL (ref 6–24)
Bilirubin Total: 0.6 mg/dL (ref 0.0–1.2)
CO2: 23 mmol/L (ref 20–29)
Calcium: 9.8 mg/dL (ref 8.7–10.2)
Chloride: 104 mmol/L (ref 96–106)
Creatinine, Ser: 0.99 mg/dL (ref 0.57–1.00)
Globulin, Total: 3.2 g/dL (ref 1.5–4.5)
Glucose: 83 mg/dL (ref 70–99)
Potassium: 4.5 mmol/L (ref 3.5–5.2)
Sodium: 140 mmol/L (ref 134–144)
Total Protein: 7.6 g/dL (ref 6.0–8.5)
eGFR: 67 mL/min/{1.73_m2} (ref >59)

## 2021-08-13 LAB — CBC WITH DIFFERENTIAL/PLATELET
Basophils Absolute: 0 10*3/uL (ref 0.0–0.2)
Basos: 1 %
EOS (ABSOLUTE): 0 10*3/uL (ref 0.0–0.4)
Eos: 1 %
Hematocrit: 38 % (ref 34.0–46.6)
Hemoglobin: 12.6 g/dL (ref 11.1–15.9)
Immature Grans (Abs): 0 10*3/uL (ref 0.0–0.1)
Immature Granulocytes: 0 %
Lymphocytes Absolute: 2.8 10*3/uL (ref 0.7–3.1)
Lymphs: 46 %
MCH: 28.7 pg (ref 26.6–33.0)
MCHC: 33.2 g/dL (ref 31.5–35.7)
MCV: 87 fL (ref 79–97)
Monocytes Absolute: 0.4 10*3/uL (ref 0.1–0.9)
Monocytes: 7 %
Neutrophils Absolute: 2.7 10*3/uL (ref 1.4–7.0)
Neutrophils: 45 %
Platelets: 363 10*3/uL (ref 150–450)
RBC: 4.39 x10E6/uL (ref 3.77–5.28)
RDW: 13.2 % (ref 11.7–15.4)
WBC: 6 10*3/uL (ref 3.4–10.8)

## 2021-08-13 LAB — LIPID PANEL
Chol/HDL Ratio: 4 ratio (ref 0.0–4.4)
Cholesterol, Total: 202 mg/dL — ABNORMAL HIGH (ref 100–199)
HDL: 50 mg/dL (ref >39)
LDL Chol Calc (NIH): 132 mg/dL — ABNORMAL HIGH (ref 0–99)
Triglycerides: 113 mg/dL (ref 0–149)
VLDL Cholesterol Cal: 20 mg/dL (ref 5–40)

## 2021-08-13 LAB — TSH+FREE T4
Free T4: 1.22 ng/dL (ref 0.82–1.77)
TSH: 0.578 u[IU]/mL (ref 0.450–4.500)

## 2021-08-13 LAB — HEMOGLOBIN A1C
Est. average glucose Bld gHb Est-mCnc: 126 mg/dL
Hgb A1c MFr Bld: 6 % — ABNORMAL HIGH (ref 4.8–5.6)

## 2021-11-04 ENCOUNTER — Ambulatory Visit: Payer: BC Managed Care – PPO | Admitting: Allergy

## 2021-11-13 ENCOUNTER — Ambulatory Visit (INDEPENDENT_AMBULATORY_CARE_PROVIDER_SITE_OTHER): Payer: Self-pay | Admitting: Family Medicine

## 2021-11-13 ENCOUNTER — Encounter: Payer: Self-pay | Admitting: Family Medicine

## 2021-11-13 VITALS — BP 130/88 | HR 80 | Temp 96.8°F | Wt 165.8 lb

## 2021-11-13 DIAGNOSIS — M545 Low back pain, unspecified: Secondary | ICD-10-CM

## 2021-11-13 DIAGNOSIS — G8929 Other chronic pain: Secondary | ICD-10-CM

## 2021-11-13 LAB — POCT URINALYSIS DIP (PROADVANTAGE DEVICE)
Blood, UA: NEGATIVE
Glucose, UA: NEGATIVE mg/dL
Ketones, POC UA: NEGATIVE mg/dL
Leukocytes, UA: NEGATIVE
Nitrite, UA: NEGATIVE
Protein Ur, POC: NEGATIVE mg/dL
Specific Gravity, Urine: 1.015
Urobilinogen, Ur: 0.2
pH, UA: 7.5 (ref 5.0–8.0)

## 2021-11-13 NOTE — Progress Notes (Signed)
? ?  Subjective:  ? ? Patient ID: Helen Walker, female    DOB: 05-31-1966, 56 y.o.   MRN: [id number] ? ?HPI ?She states that on Friday she noticed some left-sided low back pain that is made worse with breathing and with motion of her back.  No history of injury or overuse.  No cough, congestion, shortness of breath, fever or chills.  She does complain of some urinary urgency and frequency.  She has been taking Tylenol.  She states that she does have a previous history of scoliosis.  She does have a previous history of back pain. ? ? ?Review of Systems ? ?   ?Objective:  ? Physical Exam ?Alert and complaining of the left-sided low back pain.  Some slight tenderness to palpation is noted in the left lumbar area.  Otherwise good motion of her back.  Normal hip motion.  No tenderness over SI joint.  Abdominal exam shows no masses or tenderness. ?Urinalysis is negative. ? ? ? ?   ?Assessment & Plan:  ?Chronic bilateral low back pain without sciatica ?You can take 2 Tylenol extra strength 3 times per day and you can also take 2 Aleve twice per day for the pain.  Heat to your back for 20 minutes 3 times per day with gentle stretching after that. ?Use Azo Standard for the bladder and see what that will do to help th ? ?

## 2021-11-13 NOTE — Patient Instructions (Signed)
You can take 2 Tylenol extra strength 3 times per day and you can also take 2 Aleve twice per day for the pain.  Heat to your back for 20 minutes 3 times per day with gentle stretching after that. ?Use Azo Standard for the bladder and see what that will do to help that ?

## 2021-12-27 ENCOUNTER — Encounter: Payer: Self-pay | Admitting: Internal Medicine

## 2021-12-31 ENCOUNTER — Telehealth: Payer: Self-pay | Admitting: Physician Assistant

## 2021-12-31 NOTE — Telephone Encounter (Signed)
Allergy Referral

## 2022-03-05 ENCOUNTER — Encounter: Payer: Self-pay | Admitting: Internal Medicine

## 2022-04-08 ENCOUNTER — Encounter: Payer: Self-pay | Admitting: Internal Medicine

## 2022-05-13 ENCOUNTER — Encounter: Payer: Self-pay | Admitting: Internal Medicine

## 2023-03-20 ENCOUNTER — Other Ambulatory Visit: Payer: Self-pay | Admitting: Nurse Practitioner

## 2023-03-20 DIAGNOSIS — Z1231 Encounter for screening mammogram for malignant neoplasm of breast: Secondary | ICD-10-CM

## 2023-04-09 ENCOUNTER — Ambulatory Visit: Payer: BC Managed Care – PPO

## 2023-04-10 DIAGNOSIS — H5213 Myopia, bilateral: Secondary | ICD-10-CM | POA: Diagnosis not present

## 2023-05-08 ENCOUNTER — Ambulatory Visit
Admission: RE | Admit: 2023-05-08 | Discharge: 2023-05-08 | Disposition: A | Discharge status: Home / Self Care | Payer: BC Managed Care – PPO | Source: Ambulatory Visit | Attending: Nurse Practitioner | Admitting: Nurse Practitioner

## 2023-05-08 DIAGNOSIS — Z1231 Encounter for screening mammogram for malignant neoplasm of breast: Secondary | ICD-10-CM

## 2023-07-14 ENCOUNTER — Encounter: Payer: Self-pay | Admitting: Nurse Practitioner

## 2023-07-29 ENCOUNTER — Encounter: Payer: Self-pay | Admitting: Nurse Practitioner

## 2023-07-29 ENCOUNTER — Ambulatory Visit: Payer: BC Managed Care – PPO | Admitting: Nurse Practitioner

## 2023-07-29 VITALS — BP 122/82 | HR 80 | Ht 67.0 in | Wt 172.4 lb

## 2023-07-29 DIAGNOSIS — F329 Major depressive disorder, single episode, unspecified: Secondary | ICD-10-CM

## 2023-07-29 DIAGNOSIS — A6000 Herpesviral infection of urogenital system, unspecified: Secondary | ICD-10-CM

## 2023-07-29 DIAGNOSIS — Z23 Encounter for immunization: Secondary | ICD-10-CM

## 2023-07-29 DIAGNOSIS — Z9109 Other allergy status, other than to drugs and biological substances: Secondary | ICD-10-CM

## 2023-07-29 DIAGNOSIS — Z0000 Encounter for general adult medical examination without abnormal findings: Secondary | ICD-10-CM

## 2023-07-29 DIAGNOSIS — M62838 Other muscle spasm: Secondary | ICD-10-CM

## 2023-07-29 DIAGNOSIS — Z90710 Acquired absence of both cervix and uterus: Secondary | ICD-10-CM

## 2023-07-29 DIAGNOSIS — G8929 Other chronic pain: Secondary | ICD-10-CM

## 2023-07-29 DIAGNOSIS — R42 Dizziness and giddiness: Secondary | ICD-10-CM

## 2023-07-29 DIAGNOSIS — R232 Flushing: Secondary | ICD-10-CM | POA: Insufficient documentation

## 2023-07-29 DIAGNOSIS — E8940 Asymptomatic postprocedural ovarian failure: Secondary | ICD-10-CM

## 2023-07-29 DIAGNOSIS — M545 Low back pain, unspecified: Secondary | ICD-10-CM

## 2023-07-29 HISTORY — DX: Flushing: R23.2

## 2023-07-29 LAB — LIPID PANEL

## 2023-07-29 MED ORDER — TIZANIDINE HCL 4 MG PO TABS: 4.0000 mg | ORAL_TABLET | Freq: Every day | ORAL | 0 refills | Status: DC

## 2023-07-29 MED ORDER — BUPROPION HCL ER (XL) 150 MG PO TB24: 150.0000 mg | ORAL_TABLET | Freq: Every day | ORAL | 12 refills | Status: DC

## 2023-07-29 MED ORDER — VEOZAH 45 MG PO TABS: 1.0000 | ORAL_TABLET | Freq: Every day | ORAL | 11 refills | Status: DC

## 2023-07-29 MED ORDER — VALACYCLOVIR HCL 500 MG PO TABS: 500.0000 mg | ORAL_TABLET | Freq: Two times a day (BID) | ORAL | 1 refills | Status: DC

## 2023-07-29 MED ORDER — MECLIZINE HCL 25 MG PO TABS: 25.0000 mg | ORAL_TABLET | Freq: Three times a day (TID) | ORAL | 0 refills | Status: DC | PRN

## 2023-07-29 NOTE — Assessment & Plan Note (Signed)
Experiencing hot flashes for the past couple of years. Given family history of breast and ovarian cancer, HRT is not recommended. Discussed Wellbutrin and Veozah as alternatives. Wellbutrin may help with libido and weight loss but has variable effectiveness on hot flashes. Allyne Gee is a newer non-hormonal medication with potential benefits but is expensive and may not be covered by insurance. - Prescribe Wellbutrin - Provide samples of Veozah - Advise starting Wellbutrin when off work - Provide coupon and sample pack for [employer] response to [employer]  over the next two weeks - Submit prior authorization for [employer]  if effective

## 2023-07-29 NOTE — Assessment & Plan Note (Signed)
Intermittent back pain exacerbated by walking on hard surfaces with steel toe boots at work. Uses tizanidine as needed for relief. Regular exercise and stretching recommended. - Encourage regular stretching exercises - Advise continuing tizanidine as needed

## 2023-07-29 NOTE — Patient Instructions (Signed)
I have sent in the [facility] for the hot flashes. You can start this at any time.   I have sent in the Wellbutrin for libido changes. You can start this on your day off if you like. I do recommend taking it in the morning.   It can take 8 weeks or so for these to fully kick in, but you should feel some differences in the next 7014 days.

## 2023-07-29 NOTE — Progress Notes (Signed)
Shawna Clamp, DNP, AGNP-c [facility] [address] [place], [place] [address] Main Office [phone]  BP 122/82   Pulse 80   Ht 5\' 7"  (1.702 m)   Wt 172 lb 6.4 oz (78.2 kg)   LMP 11/16/2014 (Approximate)   BMI 27.00 kg/m    Subjective:    Patient ID: Helen Walker, female    DOB: [birth year], 58 y.o.   MRN: [id number]  HPI: Helen Walker is a 58 y.o. female presenting on 07/29/2023 for comprehensive medical examination.   History of Present Illness Helen Walker reports concern today hot flashes and back pain in addition to CPE.   She has been experiencing hot flashes for approximately a year and a half. She is not on any medications for this issue. She had a hysterectomy in 2017, during which one ovary was removed due to a cyst. She is cautious about hormone replacement therapy due to her family history of breast and ovarian issues. Her mother was on estrogen therapy and had related health issues, which influences her decision to avoid hormone therapy, which I agree is best in her personal situation. She also mentions concerns with low libido.   She is experiencing back pain, which she attributes to her new job at [employer], where she has been working since October. The job involves walking on hard surfaces with steel-toe boots, which she feels is contributing to her back discomfort. She uses tizanidine as needed for this pain. She mentions she has scoliosis, which may contribute to her back issues.  She takes meclizine as needed for dizziness.    She also experiences phlegm in the mornings, which she manages with over-the-counter sinus medication.   She is generally in good health, with no significant issues reported in hearing, vision, or swelling in feet or ankles. She is proactive about her health, mindful of her family history of diabetes, and maintains a healthy lifestyle. Pertinent items are noted in HPI.  Most Recent Depression Screen:     07/29/2023    10:14 AM 07/29/2023   10:11 AM 11/13/2021    9:08 AM 10/29/2020   10:07 AM 08/09/2020    9:23 AM  Depression screen PHQ 2/9  Decreased Interest 0 0 0 0 0  Down, Depressed, Hopeless 1 1 0 0 0  PHQ - 2 Score 1 1 0 0 0  Altered sleeping 1      Tired, decreased energy 1      Change in appetite 0      Feeling bad or failure about yourself  0      Trouble concentrating 1      Moving slowly or fidgety/restless 0      Suicidal thoughts 0      PHQ-9 Score 4      Difficult doing work/chores Somewhat difficult       Most Recent Anxiety Screen:      No data to display         Most Recent Fall Screen:    07/29/2023   10:11 AM 01/31/2021   11:25 AM 10/29/2020   10:07 AM 08/09/2020    9:23 AM 03/02/2018    9:06 AM  Fall Risk   Falls in the past year? 0 0 0 0 No  Number falls in past yr: 0 0 0 0   Injury with Fall? 0 0 0 0   Risk for fall due to : No Fall Risks No Fall Risks No Fall Risks    Follow up Falls  evaluation completed Falls evaluation completed Falls evaluation completed      Past medical history, surgical history, medications, allergies, family history and social history reviewed with patient today and changes made to appropriate areas of the chart.  Past Medical History:  Past Medical History:  Diagnosis Date   Bladder mass 2013   Dysuria 04/15/2021   Encounter for postoperative care 01/23/2015   Medical history non-contributory    Perimenopausal vasomotor symptoms 12/07/2014   Urinary frequency 04/15/2021   Medications:  No current outpatient medications on file prior to visit.   No current facility-administered medications on file prior to visit.   Surgical History:  Past Surgical History:  Procedure Laterality Date   APPENDECTOMY N/A 01/23/2015   Procedure: APPENDECTOMY;  Surgeon: Ok Edwards, MD;  Location: [facility];  Service: Gynecology;  Laterality: N/A;   CESAREAN SECTION  1990  &  1993   CYSTOSCOPY  07/11/2011   Procedure: CYSTOSCOPY;  Surgeon: Lindaann Slough, MD;  Location: [facility];  Service: Urology;  Laterality: N/A;  TUR Bladder Mass   CYSTOSCOPY W/ RETROGRADES  07/11/2011   Procedure: CYSTOSCOPY WITH RETROGRADE PYELOGRAM;  Surgeon: Lindaann Slough, MD;  Location: [facility];  Service: Urology;  Laterality: Bilateral;  retrograde of bladder   HYSTEROSCOPY W/ ENDOMETRIAL ABLATION  07-17-2006   LAPAROSCOPIC ASSISTED VAGINAL HYSTERECTOMY N/A 01/23/2015   Procedure:  total abdominal hysterectomy , right salpingectomy oophorectomy;  Surgeon: Ok Edwards, MD;  Location: [facility];  Service: Gynecology;  Laterality: N/A;  request to follow first case  requests 2 1/2 hours OR time.  needs cystoscope and harmonic scalpel   LAPAROSCOPIC LYSIS OF ADHESIONS N/A 01/23/2015   Procedure: LAPAROSCOPIC LYSIS OF ADHESIONS;  Surgeon: Ok Edwards, MD;  Location: [facility];  Service: Gynecology;  Laterality: N/A;   TONSILLECTOMY     TOTAL ABDOMINAL HYSTERECTOMY     TRANSURETHRAL RESECTION OF BLADDER TUMOR  07/11/2011   Procedure: TRANSURETHRAL RESECTION OF BLADDER TUMOR (TURBT);  Surgeon: Lindaann Slough, MD;  Location: [facility];  Service: Urology;  Laterality: N/A;   TUBAL LIGATION  1995   WISDOM TOOTH EXTRACTION     Allergies:  No Known Allergies Family History:  Family History  Problem Relation Age of Onset   Hypertension Mother    Diabetes Mother    Breast cancer Mother 63   Kidney disease Paternal Grandmother    Colon polyps Neg Hx    Colon cancer Neg Hx    Esophageal cancer Neg Hx    Rectal cancer Neg Hx    Stomach cancer Neg Hx        Objective:    BP 122/82   Pulse 80   Ht 5\' 7"  (1.702 m)   Wt 172 lb 6.4 oz (78.2 kg)   LMP 11/16/2014 (Approximate)   BMI 27.00 kg/m   Wt Readings from Last 3 Encounters:  07/29/23 172 lb 6.4 oz (78.2 kg)  11/13/21 165 lb 12.8 oz (75.2 kg)  08/12/21 159 lb 9.6 oz (72.4 kg)    Physical Exam Vitals and nursing note reviewed.  Constitutional:       General: She is not in acute distress.    Appearance: Normal appearance.  HENT:     Head: Normocephalic and atraumatic.     Right Ear: Hearing, tympanic membrane, ear canal and external ear normal.     Left Ear: Hearing, tympanic membrane, ear canal and external ear normal.     Nose: Nose normal.  Right Sinus: No maxillary sinus tenderness or frontal sinus tenderness.     Left Sinus: No maxillary sinus tenderness or frontal sinus tenderness.     Mouth/Throat:     Lips: Pink.     Mouth: Mucous membranes are moist.     Pharynx: Oropharynx is clear.  Eyes:     General: Lids are normal. Vision grossly intact.     Extraocular Movements: Extraocular movements intact.     Conjunctiva/sclera: Conjunctivae normal.     Pupils: Pupils are equal, round, and reactive to light.     Funduscopic exam:    Right eye: Red reflex present.        Left eye: Red reflex present.    Visual Fields: Right eye visual fields normal and left eye visual fields normal.  Neck:     Thyroid: No thyromegaly.     Vascular: No carotid bruit.  Cardiovascular:     Rate and Rhythm: Normal rate and regular rhythm.     Chest Wall: PMI is not displaced.     Pulses: Normal pulses.          Dorsalis pedis pulses are 2+ on the right side and 2+ on the left side.       Posterior tibial pulses are 2+ on the right side and 2+ on the left side.     Heart sounds: Normal heart sounds. No murmur heard. Pulmonary:     Effort: Pulmonary effort is normal. No respiratory distress.     Breath sounds: Normal breath sounds.  Abdominal:     General: Abdomen is flat. Bowel sounds are normal. There is no distension.     Palpations: Abdomen is soft. There is no hepatomegaly, splenomegaly or mass.     Tenderness: There is no abdominal tenderness. There is no right CVA tenderness, left CVA tenderness, guarding or rebound.  Musculoskeletal:        General: Normal range of motion.     Cervical back: Full passive range of motion without  pain, normal range of motion and neck supple. No tenderness.     Right lower leg: No edema.     Left lower leg: No edema.  Feet:     Left foot:     Toenail Condition: Left toenails are normal.  Lymphadenopathy:     Cervical: No cervical adenopathy.     Upper Body:     Right upper body: No supraclavicular adenopathy.     Left upper body: No supraclavicular adenopathy.  Skin:    General: Skin is warm and dry.     Capillary Refill: Capillary refill takes less than 2 seconds.     Nails: There is no clubbing.  Neurological:     General: No focal deficit present.     Mental Status: She is alert and oriented to person, place, and time.     GCS: GCS eye subscore is 4. GCS verbal subscore is 5. GCS motor subscore is 6.     Sensory: Sensation is intact.     Motor: Motor function is intact.     Coordination: Coordination is intact.     Gait: Gait is intact.     Deep Tendon Reflexes: Reflexes are normal and symmetric.  Psychiatric:        Attention and Perception: Attention normal.        Mood and Affect: Mood normal.        Speech: Speech normal.        Behavior: Behavior normal. Behavior is  cooperative.        Thought Content: Thought content normal.        Cognition and Memory: Cognition and memory normal.        Judgment: Judgment normal.      Results for orders placed or performed in visit on 11/13/21  POCT Urinalysis DIP (Proadvantage Device)   Collection Time: 11/13/21 12:25 PM  Result Value Ref Range   Color, UA yellow yellow   Clarity, UA clear clear   Glucose, UA negative negative mg/dL   Bilirubin, UA small (A) negative   Ketones, POC UA negative negative mg/dL   Specific Gravity, Urine 1.015    Blood, UA negative negative   pH, UA 7.5 5.0 - 8.0   Protein Ur, POC negative negative mg/dL   Urobilinogen, Ur 0.2    Nitrite, UA Negative Negative   Leukocytes, UA Negative Negative       Assessment & Plan:   Problem List Items Addressed This Visit     Genital  herpes   Daily valtrex use with no concerns or symptoms reported  - Continue valtrex      Relevant Medications   valACYclovir (VALTREX) 500 MG tablet   Muscle spasm   Intermittent back pain exacerbated by walking on hard surfaces with steel toe boots at work. Uses tizanidine as needed for relief. Regular exercise and stretching recommended. - Encourage regular stretching exercises - Advise continuing tizanidine as needed      Relevant Medications   tiZANidine (ZANAFLEX) 4 MG tablet   Chronic bilateral low back pain without sciatica   Intermittent back pain exacerbated by walking on hard surfaces with steel toe boots at work. Uses tizanidine as needed for relief. Regular exercise and stretching recommended. - Encourage regular stretching exercises - Advise continuing tizanidine as needed      Relevant Medications   tiZANidine (ZANAFLEX) 4 MG tablet   buPROPion (WELLBUTRIN XL) 150 MG 24 hr tablet   Environmental allergies   Reports phlegm in the morning, likely due to postnasal drip. Symptoms improve with over-the-counter sinus medication. She also has intermittent dizziness, which may stem from eustachian tube dysfunction.  - Recommend allergy medication like Xyzal - Advise increased water intake - Continue meclizine as needed      Reactive depression (situational)   Relevant Medications   buPROPion (WELLBUTRIN XL) 150 MG 24 hr tablet   Other Relevant Orders   Ambulatory referral to Psychiatry   Encounter for annual physical exam - Primary   CPE completed today. Review of HM activities and recommendations discussed and provided on AVS. Anticipatory guidance, diet, and exercise recommendations provided. Medications, allergies, and hx reviewed and updated as necessary. Orders placed as listed below.  Plan: - Labs ordered. Will make changes as necessary based on results.  - I will review these results and send recommendations via MyChart or a telephone call.  - F/U with CPE in 1  year or sooner for acute/chronic health needs as directed.        Relevant Orders   CBC with Differential/Platelet   CMP14+EGFR   Hemoglobin A1c   Lipid panel   TSH   Hot flashes   Experiencing hot flashes for the past couple of years. Given family history of breast and ovarian cancer, HRT is not recommended. Discussed Wellbutrin and Veozah as alternatives. Wellbutrin may help with libido and weight loss but has variable effectiveness on hot flashes. Allyne Gee is a newer non-hormonal medication with potential benefits but is expensive and may not  be covered by insurance. - Prescribe Wellbutrin - Provide samples of Veozah - Advise starting Wellbutrin when off work - Provide coupon and sample pack for [employer] response to [employer]  over the next two weeks - Submit prior authorization for [employer]  if effective      Relevant Medications   Fezolinetant (VEOZAH) 45 MG TABS   Other Visit Diagnoses       Vertigo       per history, likely positional, and has significantly improved. Ddx discussed.  RF'd meclizine to use prn. Discussed Epley/referral if BPV recurs   Relevant Medications   meclizine (ANTIVERT) 25 MG tablet     Post hysterectomy menopause       Relevant Medications   Fezolinetant (VEOZAH) 45 MG TABS   buPROPion (WELLBUTRIN XL) 150 MG 24 hr tablet         Follow up plan: Return in about 1 year (around 07/28/2024) for CPE.  NEXT PREVENTATIVE PHYSICAL DUE IN 1 YEAR.  PATIENT COUNSELING PROVIDED FOR ALL ADULT PATIENTS: A well balanced diet low in saturated fats, cholesterol, and moderation in carbohydrates.  This can be as simple as monitoring portion sizes and cutting back on sugary beverages such as soda and juice to start with.    Daily water consumption of at least 64 ounces.  Physical activity at least 180 minutes per week.  If just starting out, start 10 minutes a day and work your way up.   This can be as simple as taking the stairs instead of the elevator and  walking 2-3 laps around the office  purposefully every day.   STD protection, partner selection, and regular testing if high risk.  Limited consumption of alcoholic beverages if alcohol is consumed. For men, I recommend no more than 14 alcoholic beverages per week, spread out throughout the week (max 2 per day). Avoid "binge" drinking or consuming large quantities of alcohol in one setting.  Please let me know if you feel you may need help with reduction or quitting alcohol consumption.   Avoidance of nicotine, if used. Please let me know if you feel you may need help with reduction or quitting nicotine use.   Daily mental health attention. This can be in the form of 5 minute daily meditation, prayer, journaling, yoga, reflection, etc.  Purposeful attention to your emotions and mental state can significantly improve your overall wellbeing  and  Health.  Please know that I am here to help you with all of your health care goals and am happy to work with you to find a solution that works best for you.  The greatest advice I have received with any changes in life are to take it one step at a time, that even means if all you can focus on is the next 60 seconds, then do that and celebrate your victories.  With any changes in life, you will have set backs, and that is OK. The important thing to remember is, if you have a set back, it is not a failure, it is an opportunity to try again! Screening Testing Mammogram Every 1 -2 years based on history and risk factors Starting at age 52 Pap Smear Ages 21-39 every 3 years Ages 26-65 every 5 years with HPV testing More frequent testing may be required based on results and history Colon Cancer Screening Every 1-10 years based on test performed, risk factors, and history Starting at age 38 Bone Density Screening Every 2-10 years based on history Starting at  age 28 for women Recommendations for men differ based on medication usage, history, and risk  factors AAA Screening One time ultrasound Men 2-14 years old who have every smoked Lung Cancer Screening Low Dose Lung CT every 12 months Age 21-80 years with a 30 pack-year smoking history who still smoke or who have quit within the last 15 years   Screening Labs Routine  Labs: Complete Blood Count (CBC), Complete Metabolic Panel (CMP), Cholesterol (Lipid Panel) Every 6-12 months based on history and medications May be recommended more frequently based on current conditions or previous results Hemoglobin A1c Lab Every 3-12 months based on history and previous results Starting at age 67 or earlier with diagnosis of diabetes, high cholesterol, BMI >26, and/or risk factors Frequent monitoring for patients with diabetes to ensure blood sugar control Thyroid Panel (TSH) Every 6 months based on history, symptoms, and risk factors May be repeated more often if on medication HIV One time testing for all patients 52 and older May be repeated more frequently for patients with increased risk factors or exposure Hepatitis C One time testing for all patients 64 and older May be repeated more frequently for patients with increased risk factors or exposure Gonorrhea, Chlamydia Every 12 months for all sexually active persons 13-24 years Additional monitoring may be recommended for those who are considered high risk or who have symptoms Every 12 months for any woman on birth control, regardless of sexual activity PSA Men 8-81 years old with risk factors Additional screening may be recommended from age 37-69 based on risk factors, symptoms, and history  Vaccine Recommendations Tetanus Booster All adults every 10 years Flu Vaccine All patients 6 months and older every year COVID Vaccine All patients 12 years and older Initial dosing with booster May recommend additional booster based on age and health history HPV Vaccine 2 doses all patients age 63-26 Dosing may be considered for patients  over 26 Shingles Vaccine (Shingrix) 2 doses all adults 55 years and older Pneumonia (Pneumovax 28) All adults 65 years and older May recommend earlier dosing based on health history One year apart from Prevnar 106 Pneumonia (Prevnar 63) All adults 65 years and older Dosed 1 year after Pneumovax 23 Pneumonia (Prevnar 20) One time alternative to the two dosing of 13 and 23 For all adults with initial dose of 23, 20 is recommended 1 year later For all adults with initial dose of 13, 23 is still recommended as second option 1 year later

## 2023-07-29 NOTE — Assessment & Plan Note (Signed)

## 2023-07-29 NOTE — Assessment & Plan Note (Signed)
Reports phlegm in the morning, likely due to postnasal drip. Symptoms improve with over-the-counter sinus medication. She also has intermittent dizziness, which may stem from eustachian tube dysfunction.  - Recommend allergy medication like Xyzal - Advise increased water intake - Continue meclizine as needed

## 2023-07-29 NOTE — Assessment & Plan Note (Signed)
Daily valtrex use with no concerns or symptoms reported  - Continue valtrex

## 2023-07-30 ENCOUNTER — Other Ambulatory Visit: Payer: Self-pay

## 2023-07-30 ENCOUNTER — Telehealth: Payer: Self-pay | Admitting: Nurse Practitioner

## 2023-07-30 DIAGNOSIS — L659 Nonscarring hair loss, unspecified: Secondary | ICD-10-CM

## 2023-07-30 LAB — CMP14+EGFR
ALT: 15 IU/L (ref 0–32)
AST: 14 IU/L (ref 0–40)
Albumin: 4.3 g/dL (ref 3.8–4.9)
Alkaline Phosphatase: 93 IU/L (ref 44–121)
BUN/Creatinine Ratio: 18 (ref 9–23)
BUN: 16 mg/dL (ref 6–24)
Bilirubin Total: 0.6 mg/dL (ref 0.0–1.2)
CO2: 22 mmol/L (ref 20–29)
Calcium: 9.7 mg/dL (ref 8.7–10.2)
Chloride: 103 mmol/L (ref 96–106)
Creatinine, Ser: 0.89 mg/dL (ref 0.57–1.00)
Globulin, Total: 3.3 g/dL (ref 1.5–4.5)
Glucose: 138 mg/dL — ABNORMAL HIGH (ref 70–99)
Potassium: 4.5 mmol/L (ref 3.5–5.2)
Sodium: 140 mmol/L (ref 134–144)
Total Protein: 7.6 g/dL (ref 6.0–8.5)
eGFR: 76 mL/min/{1.73_m2} (ref >59)

## 2023-07-30 LAB — HEMOGLOBIN A1C
Est. average glucose Bld gHb Est-mCnc: 151 mg/dL
Hgb A1c MFr Bld: 6.9 % — ABNORMAL HIGH (ref 4.8–5.6)

## 2023-07-30 LAB — LIPID PANEL
Cholesterol, Total: 236 mg/dL — ABNORMAL HIGH (ref 100–199)
HDL: 52 mg/dL (ref >39)
LDL CALC COMMENT:: 4.5 ratio — ABNORMAL HIGH (ref 0.0–4.4)
LDL Chol Calc (NIH): 158 mg/dL — ABNORMAL HIGH (ref 0–99)
Triglycerides: 146 mg/dL (ref 0–149)
VLDL Cholesterol Cal: 26 mg/dL (ref 5–40)

## 2023-07-30 LAB — CBC WITH DIFFERENTIAL/PLATELET
Basophils Absolute: 0.1 10*3/uL (ref 0.0–0.2)
Basos: 1 %
EOS (ABSOLUTE): 0.1 10*3/uL (ref 0.0–0.4)
Eos: 1 %
Hematocrit: 42.3 % (ref 34.0–46.6)
Hemoglobin: 13.7 g/dL (ref 11.1–15.9)
Immature Grans (Abs): 0 10*3/uL (ref 0.0–0.1)
Immature Granulocytes: 0 %
Lymphocytes Absolute: 3.1 10*3/uL (ref 0.7–3.1)
Lymphs: 41 %
MCH: 28.4 pg (ref 26.6–33.0)
MCHC: 32.4 g/dL (ref 31.5–35.7)
MCV: 88 fL (ref 79–97)
Monocytes Absolute: 0.5 10*3/uL (ref 0.1–0.9)
Monocytes: 7 %
Neutrophils Absolute: 3.7 10*3/uL (ref 1.4–7.0)
Neutrophils: 50 %
Platelets: 383 10*3/uL (ref 150–450)
RBC: 4.83 x10E6/uL (ref 3.77–5.28)
RDW: 12.7 % (ref 11.7–15.4)
WBC: 7.4 10*3/uL (ref 3.4–10.8)

## 2023-07-30 LAB — TSH: TSH: 0.509 u[IU]/mL (ref 0.450–4.500)

## 2023-07-30 NOTE — Telephone Encounter (Signed)
Pt came in the office today, she says she forgot to request a dermatology referral. She prefers to go to Langston Reusing, DO who is located here in [place].

## 2023-08-05 ENCOUNTER — Encounter: Payer: Self-pay | Admitting: Nurse Practitioner

## 2023-08-21 ENCOUNTER — Encounter: Payer: Self-pay | Admitting: Nurse Practitioner

## 2023-08-21 ENCOUNTER — Telehealth: Payer: Self-pay

## 2023-08-21 ENCOUNTER — Ambulatory Visit (INDEPENDENT_AMBULATORY_CARE_PROVIDER_SITE_OTHER): Payer: BC Managed Care – PPO | Admitting: Nurse Practitioner

## 2023-08-21 ENCOUNTER — Other Ambulatory Visit (HOSPITAL_COMMUNITY): Payer: Self-pay

## 2023-08-21 VITALS — BP 124/82 | HR 83 | Ht 67.0 in | Wt 168.6 lb

## 2023-08-21 DIAGNOSIS — E785 Hyperlipidemia, unspecified: Secondary | ICD-10-CM | POA: Diagnosis not present

## 2023-08-21 DIAGNOSIS — L659 Nonscarring hair loss, unspecified: Secondary | ICD-10-CM

## 2023-08-21 DIAGNOSIS — E1169 Type 2 diabetes mellitus with other specified complication: Secondary | ICD-10-CM

## 2023-08-21 DIAGNOSIS — M62838 Other muscle spasm: Secondary | ICD-10-CM

## 2023-08-21 MED ORDER — LANCET DEVICE MISC: 99 refills | Status: DC

## 2023-08-21 MED ORDER — TIRZEPATIDE 2.5 MG/0.5ML ~~LOC~~ SOAJ: 2.5000 mg | SUBCUTANEOUS | 0 refills | Status: DC

## 2023-08-21 MED ORDER — CYCLOBENZAPRINE HCL 5 MG PO TABS: 5.0000 mg | ORAL_TABLET | Freq: Every evening | ORAL | 1 refills | Status: DC | PRN

## 2023-08-21 MED ORDER — BLOOD GLUCOSE MONITORING SUPPL DEVI: 1.0000 | Freq: Three times a day (TID) | 0 refills | Status: DC

## 2023-08-21 MED ORDER — BLOOD GLUCOSE TEST VI STRP: ORAL_STRIP | 99 refills | Status: DC

## 2023-08-21 MED ORDER — LANCETS MISC. MISC: 99 refills | Status: DC

## 2023-08-21 MED ORDER — ONDANSETRON 4 MG PO TBDP: 4.0000 mg | ORAL_TABLET | Freq: Three times a day (TID) | ORAL | 0 refills | Status: DC | PRN

## 2023-08-21 NOTE — Telephone Encounter (Signed)
 Pt. Came into the office for an appointment today and asked about her Derm referral. She wanted to know if it could be sent to [facility], because the office that called her is booking out until Aug. She wanted to try to see Annitta Jersey at [facility].

## 2023-08-21 NOTE — Patient Instructions (Signed)
 I have sent the mounjaro to see if we get coverage for this.   Type 2 Diabetes Mellitus, Diagnosis, Adult Type 2 diabetes (type 2 diabetes mellitus) is a long-term, or chronic, disease. In type 2 diabetes, one or both of these problems may be present: The pancreas does not make enough of a hormone called insulin. Cells in the body do not respond properly to the insulin that the body makes (insulin resistance). Normally, insulin allows blood sugar (glucose) to enter cells in the body. The cells use glucose for energy. Insulin resistance or lack of insulin causes excess glucose to build up in the blood instead of going into cells. This causes high blood glucose (hyperglycemia).  What are the causes? The exact cause of type 2 diabetes is not known. What increases the risk? The following factors may make you more likely to develop this condition: Having a family member with type 2 diabetes. Being overweight or obese. Being inactive (sedentary). Having been diagnosed with insulin resistance. Having a history of prediabetes, diabetes when you were pregnant (gestational diabetes), or polycystic ovary syndrome (PCOS). What are the signs or symptoms? In the [place] stage of this condition, you may not have symptoms. Symptoms develop slowly and may include: Increased thirst or hunger. Increased urination. Unexplained weight loss. Tiredness (fatigue) or weakness. Vision changes, such as blurry vision. Dark patches on the skin. How is this diagnosed? This condition is diagnosed based on your symptoms, your medical history, a physical exam, and your blood glucose level. Your blood glucose may be checked with one or more of the following blood tests: A fasting blood glucose (FBG) test. You will not be allowed to eat (you will fast) for 8 hours or longer before a blood sample is taken. A random blood glucose test. This test checks blood glucose at any time of day regardless of when you ate. An A1C  (hemoglobin A1C) blood test. This test provides information about blood glucose levels over the previous 2-3 months. An oral glucose tolerance test (OGTT). This test measures your blood glucose at two times: After fasting. This is your baseline blood glucose level. Two hours after drinking a beverage that contains glucose. You may be diagnosed with type 2 diabetes if: Your fasting blood glucose level is 126 mg/dL (7.0 mmol/L) or higher. Your random blood glucose level is 200 mg/dL (30.8 mmol/L) or higher. Your A1C level is 6.5% or higher. Your oral glucose tolerance test result is higher than 200 mg/dL (65.7 mmol/L). These blood tests may be repeated to confirm your diagnosis. How is this treated? Your treatment may be managed by a specialist called an endocrinologist. Type 2 diabetes may be treated by following instructions from your health care provider about: Making dietary and lifestyle changes. These may include: Following a personalized nutrition plan that is developed by a registered dietitian. Exercising regularly. Finding ways to manage stress. Checking your blood glucose level as often as told. Taking diabetes medicines or insulin daily. This helps to keep your blood glucose levels in the healthy range. Taking medicines to help prevent complications from diabetes. Medicines may include: Aspirin. Medicine to lower cholesterol. Medicine to control blood pressure. Your health care provider will set treatment goals for you. Your goals will be based on your age, other medical conditions you have, and how you respond to diabetes treatment. Generally, the goal of treatment is to maintain the following blood glucose levels: Before meals: 80-130 mg/dL (8.4-6.9 mmol/L). After meals: below 180 mg/dL (10 mmol/L). A1C level:  less than 7%. Follow these instructions at home: Questions to ask your health care provider Consider asking the following questions: Should I meet with a certified  diabetes care and education specialist? What diabetes medicines do I need, and when should I take them? What equipment will I need to manage my diabetes at home? How often do I need to check my blood glucose? Where can I find a support group for people with diabetes? What number can I call if I have questions? When is my next appointment? General instructions Take over-the-counter and prescription medicines only as told by your health care provider. Keep all follow-up visits. This is important. Where to find more information For help and guidance and for more information about diabetes, please visit: American Diabetes Association (ADA): www.diabetes.org American Association of Diabetes Care and Education Specialists (ADCES): www.diabeteseducator.org International Diabetes Federation (IDF): DCOnly.dk Contact a health care provider if: Your blood glucose is at or above 240 mg/dL (09.8 mmol/L) for 2 days in a row. You have been sick or have had a fever for 2 days or longer, and you are not getting better. You have any of the following problems for more than 6 hours: You cannot eat or drink. You have nausea and vomiting. You have diarrhea. Get help right away if: You have severe hypoglycemia. This means your blood glucose is lower than 54 mg/dL (3.0 mmol/L). You become confused or you have trouble thinking clearly. You have difficulty breathing. You have moderate or large ketone levels in your urine. These symptoms may represent a serious problem that is an emergency. Do not wait to see if the symptoms will go away. Get medical help right away. Call your local emergency services (911 in the [place] Do not drive yourself to the hospital. Summary Type 2 diabetes mellitus is a long-term, or chronic, disease. In type 2 diabetes, the pancreas does not make enough of a hormone called insulin, or cells in the body do not respond properly to insulin that the body makes. This condition is treated  by making dietary and lifestyle changes and taking diabetes medicines or insulin. Your health care provider will set treatment goals for you. Your goals will be based on your age, other medical conditions you have, and how you respond to diabetes treatment. Keep all follow-up visits. This is important. This information is not intended to replace advice given to you by your health care provider. Make sure you discuss any questions you have with your health care provider. Document Revised: 09/10/2020 Document Reviewed: 09/10/2020 Elsevier Patient Education  2024 [employer].

## 2023-08-21 NOTE — Progress Notes (Signed)
  Shawna Clamp, DNP, AGNP-c [facility]  [address] [place], [place] [address] [phone]  ESTABLISHED PATIENT- Chronic Health and/or Follow-Up Visit  Blood pressure 124/82, pulse 83, height 5\' 7"  (1.702 m), weight 168 lb 9.6 oz (76.5 kg), last menstrual period 11/16/2014.    Helen Walker is a 58 y.o. year old female presenting today for evaluation and management of chronic conditions.   History of Present Illness NYSA SARIN is a 58 year old female with prediabetes who presents with elevated blood sugar levels.  She has a hemoglobin A1c of 6.9, indicating progression to diabetes despite maintaining a healthy lifestyle for at least nine years. Her lifestyle includes a low-carbohydrate diet, eating every two hours, and regular physical activity. She acknowledges a genetic component to her condition, as her mother also had diabetes, and she has experience managing diabetes from caring for her mother.  She experiences side effects from a muscle relaxant, causing stomach upset, but has previously used Flexeril with good effect. She is interested in exploring other medication options for her muscle issues.  Her cholesterol level is 158, attributed to familial hyperlipidemia. She is aware of the importance of managing her cholesterol to protect her kidneys and liver, which are currently functioning well.  All ROS negative with exception of what is listed above.   PHYSICAL EXAM Physical Exam Vitals and nursing note reviewed.  Constitutional:      General: She is not in acute distress.    Appearance: Normal appearance. She is normal weight. She is not ill-appearing.  Eyes:     Conjunctiva/sclera: Conjunctivae normal.  Neck:     Vascular: No carotid bruit.  Cardiovascular:     Rate and Rhythm: Normal rate and regular rhythm.     Pulses: Normal pulses.  Pulmonary:     Effort: Pulmonary effort is normal.  Musculoskeletal:        General: Normal range of  motion.     Cervical back: Normal range of motion.  Skin:    General: Skin is warm and dry.  Neurological:     General: No focal deficit present.     Mental Status: She is alert and oriented to person, place, and time.  Psychiatric:        Mood and Affect: Mood normal.        Behavior: Behavior normal.      PLAN Problem List Items Addressed This Visit   None   No follow-ups on file.  Shawna Clamp, DNP, AGNP-c

## 2023-08-21 NOTE — Telephone Encounter (Signed)
 Pharmacy Patient Advocate Encounter   Received notification from CoverMyMeds that prior authorization for Mounjaro 2.5MG /0.5ML auto-injectors  is required/requested.   Insurance verification completed.   The patient is insured through [employer] .   Per test claim: PA required; PA submitted to above mentioned insurance via CoverMyMeds Key/confirmation #/EOC Key: [id number]   Status is pending

## 2023-08-24 ENCOUNTER — Encounter: Payer: Self-pay | Admitting: Nurse Practitioner

## 2023-08-24 ENCOUNTER — Telehealth: Payer: Self-pay | Admitting: Nurse Practitioner

## 2023-08-24 ENCOUNTER — Telehealth: Payer: Self-pay

## 2023-08-24 ENCOUNTER — Other Ambulatory Visit (HOSPITAL_COMMUNITY): Payer: Self-pay

## 2023-08-24 DIAGNOSIS — E1169 Type 2 diabetes mellitus with other specified complication: Secondary | ICD-10-CM

## 2023-08-24 DIAGNOSIS — L659 Nonscarring hair loss, unspecified: Secondary | ICD-10-CM | POA: Insufficient documentation

## 2023-08-24 DIAGNOSIS — E119 Type 2 diabetes mellitus without complications: Secondary | ICD-10-CM | POA: Insufficient documentation

## 2023-08-24 HISTORY — DX: Nonscarring hair loss, unspecified: L65.9

## 2023-08-24 NOTE — Assessment & Plan Note (Signed)
 Reports that the previously prescribed muscle relaxant caused stomach upset. Flexeril (cyclobenzaprine) was effective in the past. - Prescribe Flexeril (cyclobenzaprine) for muscle spasms

## 2023-08-24 NOTE — Assessment & Plan Note (Signed)
 Reports hair loss at the scalp. A referral to dermatology was previously made, but the appointment is not until August. - Request an earlier appointment with dermatologist Lannie Fields if possible

## 2023-08-24 NOTE — Telephone Encounter (Signed)
 Copied from CRM [id number]. Topic: Clinical - Medication Question >> Aug 24, 2023  8:32 AM Gery Pray wrote: Reason for CRM: Patient is wanting to change her tirzepatide [facility]) 2.5 MG/0.5ML Pen to Metformin. Patient stated that she does not feel comfortable taking the pen and would like to do the Metformin pill.   Pt. Would like to dp Metformin instead of injection.

## 2023-08-24 NOTE — Telephone Encounter (Unsigned)
 Copied from CRM [id number]. Topic: Clinical - Medication Question >> Aug 24, 2023  8:32 AM Gery Pray wrote: Reason for CRM: Patient is wanting to change her tirzepatide [facility]) 2.5 MG/0.5ML Pen to Metformin. Patient stated that she does not feel comfortable taking the pen and would like to do the Metformin pill.

## 2023-08-24 NOTE — Assessment & Plan Note (Signed)
 Hemoglobin A1c is 6.9%, indicating diabetes. She has a family history of diabetes and has maintained a healthy lifestyle, likely delaying disease progression. Discussed the pathophysiology of diabetes, including insulin resistance and the role of the pancreas and brain in glucose regulation. Explained the benefits and side effects of GLP-1 receptor agonists (Mounjaro, Ozempic) in managing blood sugar, reducing cardiovascular risks, and promoting weight loss. GLP-1 receptor agonists can reduce the risk of heart attack, stroke, Alzheimer's disease, and lower cholesterol and blood pressure. - Prescribe Mounjaro (GLP-1 receptor agonist) with instructions for use - Send prescription for Zofran for potential nausea - Provide educational materials on diabetes management and dietary options - Schedule follow-up appointment in 3 months for blood work and assessment

## 2023-08-24 NOTE — Assessment & Plan Note (Signed)
 Cholesterol levels are elevated, with LDL at 158 mg/dL, likely due to familial hyperlipidemia. Discussed the potential for GLP-1 receptor agonists to lower cholesterol levels and improve cardiovascular health. - Monitor cholesterol levels during the next 3 months while on Mounjaro - Reassess cholesterol levels at the 6-month follow-up appointment

## 2023-08-24 NOTE — Telephone Encounter (Signed)
 Pharmacy Patient Advocate Encounter  Received notification from [employer] that Prior Authorization for [facility] 2.5MG /0.5ML auto-injectors has been APPROVED from 1.21.25 to 2.20.26. Ran test claim, Copay is $RTS, Rx was last filled on 2.22.25. This test claim was processed through [facility]- copay amounts may vary at other pharmacies due to pharmacy/plan contracts, or as the patient moves through the different stages of their insurance plan.   PA #/Case ID/Reference #: (Key: [id number])

## 2023-08-25 MED ORDER — METFORMIN HCL 500 MG PO TABS: 500.0000 mg | ORAL_TABLET | Freq: Two times a day (BID) | ORAL | 3 refills | Status: DC

## 2023-08-25 NOTE — Addendum Note (Signed)
 Addended by: Maeson Lourenco, Huntley Dec E on: 08/25/2023 07:05 PM   Modules accepted: Orders

## 2023-08-25 NOTE — Telephone Encounter (Signed)
 Please let her know I have sent the metformin in for her. If she would like to consider mounjaro in the future, she can let me know and I will be happy to bring her in so we can teach her how to give the injection.

## 2023-12-11 ENCOUNTER — Encounter: Payer: Self-pay | Admitting: Nurse Practitioner

## 2023-12-11 ENCOUNTER — Ambulatory Visit (INDEPENDENT_AMBULATORY_CARE_PROVIDER_SITE_OTHER): Admitting: Nurse Practitioner

## 2023-12-11 VITALS — BP 106/68 | HR 86 | Ht 67.0 in | Wt 164.8 lb

## 2023-12-11 DIAGNOSIS — E785 Hyperlipidemia, unspecified: Secondary | ICD-10-CM | POA: Diagnosis not present

## 2023-12-11 DIAGNOSIS — E1169 Type 2 diabetes mellitus with other specified complication: Secondary | ICD-10-CM

## 2023-12-11 DIAGNOSIS — F329 Major depressive disorder, single episode, unspecified: Secondary | ICD-10-CM | POA: Diagnosis not present

## 2023-12-11 DIAGNOSIS — L659 Nonscarring hair loss, unspecified: Secondary | ICD-10-CM

## 2023-12-11 DIAGNOSIS — A6000 Herpesviral infection of urogenital system, unspecified: Secondary | ICD-10-CM | POA: Diagnosis not present

## 2023-12-11 DIAGNOSIS — R232 Flushing: Secondary | ICD-10-CM

## 2023-12-11 MED ORDER — METFORMIN HCL ER 500 MG PO TB24: ORAL_TABLET | ORAL | 3 refills | Status: DC

## 2023-12-11 MED ORDER — VALACYCLOVIR HCL 500 MG PO TABS: 500.0000 mg | ORAL_TABLET | Freq: Two times a day (BID) | ORAL | 6 refills | Status: AC

## 2023-12-11 NOTE — Patient Instructions (Signed)
 You look fabulous!!   I have sent the referral to Dr. Dorisann Garre for your hair.

## 2023-12-11 NOTE — Progress Notes (Unsigned)
 Dell Fennel, DNP, AGNP-c [facility]  [address] [place], [place] [address] [phone]  ESTABLISHED PATIENT- Chronic Health and/or Follow-Up Visit  Blood pressure 106/68, pulse 86, height 5' 7 (1.702 m), weight 164 lb 12.8 oz (74.8 kg), last menstrual period 11/16/2014, SpO2 96%.   History of Present Illness Helen Walker is a 58 year old female with type 2 diabetes who presents for a routine follow-up visit.  She is feeling well overall with no increased thirst, hunger, or frequent urination. Her diabetes is managed with metformin , 500 mg twice daily. Initially, she experienced minor stomach upset with metformin  but now tolerates it well. She has lost weight and maintains a balanced diet, eating every couple of hours. She participates in regular exercise through a work program called [employer] and also exercises at home.  She is experiencing hot flashes and hair loss. She attributes the hair loss to menopause and possibly hairstyles. She is considering consulting a dermatologist for her alopecia.  She started therapy in February, attending once a month, and finds it beneficial. Her job covers eight sessions annually.  She works at [employer] and speaks positively about her employment there. She has a supportive family, including a son who travels extensively and keeps her engaged in various activities. She is also considering completing her degree, inspired by her mother's late academic achievements.    All ROS negative with exception of what is listed above.   PHYSICAL EXAM Physical Exam Vitals and nursing note reviewed.  Constitutional:      Appearance: Normal appearance.  HENT:     Head: Normocephalic.   Eyes:     Pupils: Pupils are equal, round, and reactive to light.    Cardiovascular:     Rate and Rhythm: Normal rate and regular rhythm.     Pulses: Normal pulses.     Heart sounds: Normal heart sounds.  Pulmonary:     Effort:  Pulmonary effort is normal.     Breath sounds: Normal breath sounds.   Musculoskeletal:        General: Normal range of motion.     Cervical back: Normal range of motion.   Skin:    General: Skin is warm.   Neurological:     General: No focal deficit present.     Mental Status: She is alert and oriented to person, place, and time.   Psychiatric:        Mood and Affect: Mood normal.      PLAN Problem List Items Addressed This Visit     Diabetes mellitus (HCC) - Primary   Relevant Medications   metFORMIN  (GLUCOPHAGE -XR) 500 MG 24 hr tablet   Other Relevant Orders   Hemoglobin A1c (Completed)   CBC with Differential/Platelet (Completed)   Comprehensive metabolic panel with GFR (Completed)   Microalbumin / creatinine urine ratio (Completed)   Hyperlipidemia associated with type 2 diabetes mellitus (HCC)   Relevant Medications   metFORMIN  (GLUCOPHAGE -XR) 500 MG 24 hr tablet   Other Relevant Orders   Hemoglobin A1c (Completed)   CBC with Differential/Platelet (Completed)   Comprehensive metabolic panel with GFR (Completed)   Genital herpes   Relevant Medications   valACYclovir  (VALTREX ) 500 MG tablet   Reactive depression (situational)   Hot flashes   Other Visit Diagnoses       Alopecia       Relevant Orders   Ambulatory referral to Dermatology     ype 2 Diabetes Mellitus Well-controlled with metformin . No symptoms of polydipsia,  polyphagia, or polyuria. Proactive health management and lifestyle modifications are beneficial in preventing complications. Discussed switching to metformin  XR for convenience and improved tolerance due to its slower release. Initial minor gastrointestinal upset with current regimen has resolved. - Switch to metformin  XR 500 mg once daily for convenience and improved tolerance. - Check hemoglobin A1c to monitor glycemic control.  Alopecia Reports hair thinning, possibly related to menopause and hairstyling practices. Considering  dermatological consultation for further evaluation and management. Discussed potential treatments including minoxidil and finasteride. - Refer to dermatologist for evaluation and management of alopecia.  General Health Maintenance Engages in regular exercise and maintains a balanced diet. Weight loss achieved. Attends therapy sessions, which are beneficial and covered by her job for eight sessions annually. - Continue current exercise regimen and balanced diet. - Continue therapy sessions.  Return in about 6 months (around 06/11/2024) for Med Management 30.  Dell Fennel, DNP, AGNP-c

## 2023-12-12 LAB — CBC WITH DIFFERENTIAL/PLATELET
Basophils Absolute: 0.1 10*3/uL (ref 0.0–0.2)
Basos: 1 %
EOS (ABSOLUTE): 0.1 10*3/uL (ref 0.0–0.4)
Eos: 1 %
Hematocrit: 40.9 % (ref 34.0–46.6)
Hemoglobin: 13 g/dL (ref 11.1–15.9)
Immature Grans (Abs): 0 10*3/uL (ref 0.0–0.1)
Immature Granulocytes: 0 %
Lymphocytes Absolute: 2.9 10*3/uL (ref 0.7–3.1)
Lymphs: 52 %
MCH: 28.2 pg (ref 26.6–33.0)
MCHC: 31.8 g/dL (ref 31.5–35.7)
MCV: 89 fL (ref 79–97)
Monocytes Absolute: 0.4 10*3/uL (ref 0.1–0.9)
Monocytes: 7 %
Neutrophils Absolute: 2.3 10*3/uL (ref 1.4–7.0)
Neutrophils: 39 %
Platelets: 377 10*3/uL (ref 150–450)
RBC: 4.61 x10E6/uL (ref 3.77–5.28)
RDW: 13.2 % (ref 11.7–15.4)
WBC: 5.7 10*3/uL (ref 3.4–10.8)

## 2023-12-12 LAB — COMPREHENSIVE METABOLIC PANEL WITH GFR
ALT: 15 IU/L (ref 0–32)
AST: 16 IU/L (ref 0–40)
Albumin: 4.5 g/dL (ref 3.8–4.9)
Alkaline Phosphatase: 80 IU/L (ref 44–121)
BUN/Creatinine Ratio: 23 (ref 9–23)
BUN: 18 mg/dL (ref 6–24)
Bilirubin Total: 0.5 mg/dL (ref 0.0–1.2)
CO2: 20 mmol/L (ref 20–29)
Calcium: 9.8 mg/dL (ref 8.7–10.2)
Chloride: 104 mmol/L (ref 96–106)
Creatinine, Ser: 0.79 mg/dL (ref 0.57–1.00)
Globulin, Total: 3.4 g/dL (ref 1.5–4.5)
Glucose: 82 mg/dL (ref 70–99)
Potassium: 4.2 mmol/L (ref 3.5–5.2)
Sodium: 139 mmol/L (ref 134–144)
Total Protein: 7.9 g/dL (ref 6.0–8.5)
eGFR: 87 mL/min/{1.73_m2} (ref >59)

## 2023-12-12 LAB — MICROALBUMIN / CREATININE URINE RATIO
Creatinine, Urine: 176.8 mg/dL
Microalb/Creat Ratio: 3 mg/g{creat} (ref 0–29)
Microalbumin, Urine: 5.3 ug/mL

## 2023-12-12 LAB — HEMOGLOBIN A1C
Est. average glucose Bld gHb Est-mCnc: 131 mg/dL
Hgb A1c MFr Bld: 6.2 % — ABNORMAL HIGH (ref 4.8–5.6)

## 2023-12-21 ENCOUNTER — Ambulatory Visit: Payer: Self-pay | Admitting: Nurse Practitioner

## 2024-02-08 ENCOUNTER — Other Ambulatory Visit: Payer: Self-pay | Admitting: Nurse Practitioner

## 2024-02-08 DIAGNOSIS — M62838 Other muscle spasm: Secondary | ICD-10-CM

## 2024-02-08 NOTE — Telephone Encounter (Signed)
 Last apt 6/13/2

## 2024-03-16 ENCOUNTER — Ambulatory Visit
Admission: RE | Admit: 2024-03-16 | Discharge: 2024-03-16 | Disposition: A | Discharge status: Home / Self Care | Source: Ambulatory Visit | Attending: Family Medicine | Admitting: Family Medicine

## 2024-03-16 ENCOUNTER — Encounter: Payer: Self-pay | Admitting: Family Medicine

## 2024-03-16 ENCOUNTER — Ambulatory Visit (INDEPENDENT_AMBULATORY_CARE_PROVIDER_SITE_OTHER): Admitting: Family Medicine

## 2024-03-16 VITALS — BP 124/80 | HR 71 | Wt 160.8 lb

## 2024-03-16 DIAGNOSIS — R82998 Other abnormal findings in urine: Secondary | ICD-10-CM

## 2024-03-16 DIAGNOSIS — R109 Unspecified abdominal pain: Secondary | ICD-10-CM

## 2024-03-16 DIAGNOSIS — R1032 Left lower quadrant pain: Secondary | ICD-10-CM | POA: Diagnosis not present

## 2024-03-16 LAB — POCT URINE DIPSTICK
Bilirubin, UA: NEGATIVE
Blood, UA: NEGATIVE
Glucose, UA: NEGATIVE mg/dL
Ketones, POC UA: NEGATIVE mg/dL
Nitrite, UA: NEGATIVE
POC PROTEIN,UA: NEGATIVE
Spec Grav, UA: 1.01 (ref 1.010–1.025)
Urobilinogen, UA: 0.2 U/dL
pH, UA: 7.5 (ref 5.0–8.0)

## 2024-03-16 MED ORDER — POLYETHYLENE GLYCOL 3350 17 GM/SCOOP PO POWD
17.0000 g | Freq: Two times a day (BID) | ORAL | 1 refills | Status: AC | PRN
Start: 2024-03-16 — End: ?

## 2024-03-16 NOTE — Progress Notes (Signed)
   Name: VANESA RENIER   Date of Visit: 03/16/24   Date of last visit with me: Visit date not found   CHIEF COMPLAINT:  Chief Complaint  Patient presents with   Acute Visit    Pressure in vaginal area, dark urine.        HPI:  Discussed the use of AI scribe software for clinical note transcription with the patient, who gave verbal consent to proceed.  History of Present Illness   VERNADETTE STUTSMAN is a 58 year old female who presents with abdominal pressure and increased urinary frequency.  She has been experiencing abdominal pressure, described as 'a little pain' and tender to touch, located higher than the bladder area. The pressure is not related to food intake. No fever, chills, or diarrhea. She feels hungry and reports her diet has been 'pretty good' over the last few days.  Increased urinary frequency began yesterday, with an urgent need to urinate when the bladder is full. She urinated frequently yesterday and notes no improvement today.  She has a history of irritable bowel syndrome (IBS), managed through diet, with regular bowel movements at least three times a week. Her most recent bowel movement was this morning.  She has been on metformin  for six months and initially experienced increased bowel movements when starting the medication.         OBJECTIVE:       12/11/2023   10:47 AM  Depression screen PHQ 2/9  Decreased Interest 0  Down, Depressed, Hopeless 0  PHQ - 2 Score 0     BP Readings from Last 3 Encounters:  03/16/24 124/80  12/11/23 106/68  08/21/23 124/82    BP 124/80   Pulse 71   Wt 160 lb 12.8 oz (72.9 kg)   LMP 11/16/2014 (Approximate)   SpO2 99%   BMI 25.18 kg/m    Physical Exam   ABDOMEN: Tender to palpation.      Physical Exam Cardiovascular:     Rate and Rhythm: Normal rate and regular rhythm.  Pulmonary:     Effort: Pulmonary effort is normal.  Abdominal:     General: Abdomen is flat. There is no distension.      Palpations: Abdomen is soft.     Tenderness: There is abdominal tenderness (Generalized bilateral lower quadrant TTP).     ASSESSMENT/PLAN:   Assessment & Plan Dark urine  Abdominal discomfort    Assessment and Plan    Abdominal pain and pressure Symptoms suggest constipation over UTI due to clear urinalysis and absence of fever or chills. IBS history may contribute. Urine today shows no signs of infection.  - Order abdominal x-ray to assess for constipation or other causes. - Prescribe Miralax  morning and evening for one week. - Advise 60-70 ounces of water daily. - Instruct to monitor symptoms and contact if no improvement by next week. - Previous labs reviewed from 12/11/2023. CMP and A1c are stable and trending down.   Constipation Constipation possibly exacerbated by metformin . IBS history may contribute. - Prescribe Miralax  morning and evening for one week. - Advise 60-70 ounces of water daily. - Order abdominal x-ray to assess for constipation or other causes. - Instruct to monitor symptoms and contact if no improvement by next week.         Donnabelle Blanchard A. Vita MD [facility]

## 2024-03-28 ENCOUNTER — Ambulatory Visit: Payer: Self-pay | Admitting: Family Medicine

## 2024-04-04 ENCOUNTER — Other Ambulatory Visit: Payer: Self-pay | Admitting: Nurse Practitioner

## 2024-04-04 DIAGNOSIS — Z1231 Encounter for screening mammogram for malignant neoplasm of breast: Secondary | ICD-10-CM

## 2024-04-29 ENCOUNTER — Ambulatory Visit: Payer: Self-pay

## 2024-04-29 ENCOUNTER — Encounter: Payer: Self-pay | Admitting: Family Medicine

## 2024-04-29 ENCOUNTER — Ambulatory Visit (INDEPENDENT_AMBULATORY_CARE_PROVIDER_SITE_OTHER): Admitting: Family Medicine

## 2024-04-29 VITALS — BP 122/88 | HR 70 | Wt 160.2 lb

## 2024-04-29 DIAGNOSIS — E1169 Type 2 diabetes mellitus with other specified complication: Secondary | ICD-10-CM | POA: Diagnosis not present

## 2024-04-29 DIAGNOSIS — Z7984 Long term (current) use of oral hypoglycemic drugs: Secondary | ICD-10-CM

## 2024-04-29 DIAGNOSIS — R350 Frequency of micturition: Secondary | ICD-10-CM

## 2024-04-29 LAB — POCT GLYCOSYLATED HEMOGLOBIN (HGB A1C): Hemoglobin A1C: 6.3 % — AB (ref 4.0–5.6)

## 2024-04-29 LAB — POCT URINE DIPSTICK
Bilirubin, UA: NEGATIVE
Blood, UA: NEGATIVE
Glucose, UA: NEGATIVE mg/dL
Ketones, POC UA: NEGATIVE mg/dL
Leukocytes, UA: NEGATIVE
Nitrite, UA: NEGATIVE
POC PROTEIN,UA: NEGATIVE
Spec Grav, UA: <=1.005 — AB (ref 1.010–1.025)
Urobilinogen, UA: 0.2 U/dL
pH, UA: 6 (ref 5.0–8.0)

## 2024-04-29 NOTE — Progress Notes (Signed)
   Name: Helen Walker   Date of Visit: 04/29/24   Date of last visit with me: 03/16/2024   CHIEF COMPLAINT:  Chief Complaint  Patient presents with   Acute Visit    Frequent urination, nausea, headaches, thinks it could be side effects from metformin .        HPI:  Discussed the use of AI scribe software for clinical note transcription with the patient, who gave verbal consent to proceed.  History of Present Illness   KERSTIN CRUSOE is a 58 year old female with diabetes who presents with gastrointestinal symptoms and concerns about metformin  side effects.  She has been experiencing diarrhea, a metallic taste in her mouth, and headaches, which she attributes to her metformin  use. The diarrhea has been particularly bothersome over the last couple of days, causing her to be up all night. She has been on metformin  since March and notes that while she has experienced diarrhea before, the severity of symptoms last night was unusual. She is on the lowest dose of metformin .  Her A1c was 6.2 four months ago and 6.9 nine months ago. Her current A1c is 6.3.  She recalls having abdominal pressure and stool burning in the past, which improved with Miralax , but states that her current symptoms feel different.  Her mother passed away in Jul 09, 2023 and had multiple health issues, indicating a family history of complex medical conditions.  No current diarrhea. Reports headache, metallic taste in mouth, and increased urination.         OBJECTIVE:       12/11/2023   10:47 AM  Depression screen PHQ 2/9  Decreased Interest 0  Down, Depressed, Hopeless 0  PHQ - 2 Score 0     BP Readings from Last 3 Encounters:  04/29/24 122/88  03/16/24 124/80  12/11/23 106/68    BP 122/88   Pulse 70   Wt 160 lb 3.2 oz (72.7 kg)   LMP 11/16/2014 (Approximate)   SpO2 98%   BMI 25.09 kg/m    Physical Exam          Physical Exam Constitutional:      Appearance: Normal appearance.   Neurological:     General: No focal deficit present.     Mental Status: She is alert and oriented to person, place, and time. Mental status is at baseline.     ASSESSMENT/PLAN:   Assessment & Plan Type 2 diabetes mellitus with other specified complication, without long-term current use of insulin (HCC)  Frequent urination    Assessment and Plan    Prediabetes with possible adverse effects of metformin  HbA1c at 6.3%, slightly elevated. Unlikely linked to metformin  despite long-term use. Viral illness considered. - Discontinue metformin  for one week to assess symptom resolution. - If symptoms resolve, consider permanent discontinuation of metformin . - If symptoms persist, resume metformin  and consider alternative causes. - Follow up in three months for reassessment of HbA1c and symptoms. - Discussed with patient need for further weight loss if stopping metformin . Patient understanding         Delmos Velaquez A. Vita MD [facility]

## 2024-04-29 NOTE — Telephone Encounter (Signed)
 FYI Only or Action Required?: Action required by provider: request for appointment and clinical question for provider.  Patient was last seen in primary care on 03/16/2024 by Vita Morrow, MD.  Called Nurse Triage reporting No chief complaint on file..  Symptoms began several days ago.  Interventions attempted: Rest, hydration, or home remedies.  Symptoms are: unchanged.  Triage Disposition: Call PCP When Office is Open  Patient/caregiver understands and will follow disposition?: Yes   Copied from CRM [id number]. Topic: Clinical - Red Word Triage >> Apr 29, 2024  8:11 AM Carlyon D wrote: Red Word that prompted transfer to Nurse Triage: severe headache, urinating way more than usual, metallic taste mouth. Reason for Disposition  Caller wants to use a complementary or alternative medicine  Answer Assessment - Initial Assessment Questions 1. NAME of MEDICINE: What medicine(s) are you calling about?    Currently on Metformin  XR, previously on Metformin  500 mg BID   2. QUESTION: What is your question? (e.g., double dose of medicine, side effect)     Side Effects: Diarrhea, Metallic Taste, Headaches  3. PRESCRIBER: Who prescribed the medicine? Reason: if prescribed by specialist, call should be referred to that group.     Camie Doing, NP  4. SYMPTOMS: Do you have any symptoms? If Yes, ask: What symptoms are you having?  How bad are the symptoms (e.g., mild, moderate, severe)     Yes, listed above  5. PREGNANCY:  Is there any chance that you are pregnant? When was your last menstrual period?     No and No  Protocols used: Medication Question Call-A-AH

## 2024-05-11 ENCOUNTER — Ambulatory Visit
Admission: RE | Admit: 2024-05-11 | Discharge: 2024-05-11 | Disposition: A | Discharge status: Home / Self Care | Source: Ambulatory Visit | Attending: Nurse Practitioner | Admitting: Nurse Practitioner

## 2024-05-11 DIAGNOSIS — Z1231 Encounter for screening mammogram for malignant neoplasm of breast: Secondary | ICD-10-CM

## 2024-05-16 ENCOUNTER — Other Ambulatory Visit: Payer: Self-pay | Admitting: Nurse Practitioner

## 2024-05-16 DIAGNOSIS — R928 Other abnormal and inconclusive findings on diagnostic imaging of breast: Secondary | ICD-10-CM

## 2024-05-17 ENCOUNTER — Ambulatory Visit
Admission: RE | Admit: 2024-05-17 | Discharge: 2024-05-17 | Disposition: A | Discharge status: Home / Self Care | Source: Ambulatory Visit | Attending: Nurse Practitioner | Admitting: Nurse Practitioner

## 2024-05-17 ENCOUNTER — Inpatient Hospital Stay: Admission: RE | Admit: 2024-05-17 | Discharge: 2024-05-17 | Attending: Nurse Practitioner

## 2024-05-17 DIAGNOSIS — N6489 Other specified disorders of breast: Secondary | ICD-10-CM | POA: Diagnosis not present

## 2024-05-17 DIAGNOSIS — R928 Other abnormal and inconclusive findings on diagnostic imaging of breast: Secondary | ICD-10-CM | POA: Diagnosis not present

## 2024-05-30 ENCOUNTER — Encounter: Payer: Self-pay | Admitting: Family Medicine

## 2024-05-30 ENCOUNTER — Ambulatory Visit: Admitting: Family Medicine

## 2024-05-30 VITALS — BP 128/82 | HR 80 | Temp 98.6°F | Ht 68.0 in | Wt 161.2 lb

## 2024-05-30 DIAGNOSIS — J069 Acute upper respiratory infection, unspecified: Secondary | ICD-10-CM

## 2024-05-30 DIAGNOSIS — R0989 Other specified symptoms and signs involving the circulatory and respiratory systems: Secondary | ICD-10-CM | POA: Diagnosis not present

## 2024-05-30 DIAGNOSIS — R051 Acute cough: Secondary | ICD-10-CM

## 2024-05-30 LAB — POCT INFLUENZA A/B
Influenza A, POC: NEGATIVE
Influenza B, POC: NEGATIVE

## 2024-05-30 LAB — POC COVID19 BINAXNOW: SARS Coronavirus 2 Ag: NEGATIVE

## 2024-05-30 NOTE — Patient Instructions (Signed)
 Stay well hydrated. You can continue the Mucinex fast-max, and add a separate PLAIN Mucinex 12 hour tablet (only ingredient should be guaifenesin)  This is an expectorant, to help loosen up the mucus and phlegm.  You can use a daytime medication to help with your runny nose and congestion (decongestant such as sudafed, if needed, or an antihistamine like chlortrimeton or claritin if you can't tolerate the decongestants).  You may also try sinus rinses (sinus rinse kit ([employer]) or Neti-pot), up to twice daily. This will help with any sinus pressure also.  Contact us  if you have more discolored drainage or sinus pain, so that we can prescribe an antibiotic for a sinus infection (it does not appear that you have a bacterial infection at this time). Please return for re-evaluation if you are having chest pain, high fever, shortness of breath.

## 2024-05-30 NOTE — Progress Notes (Signed)
 Chief Complaint  Patient presents with   Cough    Cough and chest congestion that started 11/25. No covid/flu testing was done but it is almost 7 days. Mucus is yellow in color, low grade fever. Has had some body aches.    11/25 she started with sneezing, runny nose,  and by 11/27 it had moved into her chest. She has intermittent hoarseness, coughing, still sneezing. She had low grade fevers initially (resolved, but has been taking fever reducing medications).  Nasal drainage is yellow, starting to get lighter. No longer bloody. Phlegm isn't as dark, slight yellow.  Phlegm is thick, hard to get up. She has pain across her forehead. Teeth also hurt intermittently.  She initially stated she had been taking alka seltzer + severe cold/cough, but realized as we were speaking that she was actually using Mucinex Fast-Max cold and flu, day and night. Daytime--contains acetaminophen  and dextromethorphan.  Nighttime also has antihistamine. Neither contain guaifenesin, or any decongestant.  Denies any close sick contacts. She was in [place] last week.   PMH, PSH, SH reviewed  Diabetes Lab Results  Component Value Date   HGBA1C 6.3 (A) 04/29/2024  This A1c was while she was taking metformin . This was stopped at her last visit with Dr Vita. Sugars have remained good, not seeing any recent spikes with this illness.  Outpatient Encounter Medications as of 05/30/2024  Medication Sig Note   cyclobenzaprine  (FLEXERIL ) 5 MG tablet Take 1 tablet (5 mg total) by mouth at bedtime as needed for muscle spasms. 05/30/2024: As needed   meclizine  (ANTIVERT ) 25 MG tablet Take 1 tablet (25 mg total) by mouth 3 (three) times daily as needed for dizziness. 05/30/2024: As needed   Phenyleph-CPM-DM-Aspirin  (ALKA-SELTZER PLUS SEV COLD/CGH PO) Take 2 each by mouth every 4 (four) hours. 05/30/2024: Last dose 8 am   polyethylene glycol powder (GLYCOLAX /MIRALAX ) 17 GM/SCOOP powder Take 17 g by mouth 2 (two) times daily as  needed. Dissolve 1 capful (17g) in 4-8 ounces of liquid and take by mouth daily. 05/30/2024: As needed   valACYclovir  (VALTREX ) 500 MG tablet Take 1 tablet (500 mg total) by mouth 2 (two) times daily. X 3 days for outbreaks. 05/30/2024: As needed   metFORMIN  (GLUCOPHAGE -XR) 500 MG 24 hr tablet Take once a day with a meal (Patient not taking: Reported on 05/30/2024)    [DISCONTINUED] Blood Glucose Monitoring Suppl DEVI 1 each by Does not apply route in the morning, at noon, and at bedtime. May substitute to any manufacturer covered by patient's insurance.    [DISCONTINUED] Glucose Blood (BLOOD GLUCOSE TEST STRIPS) STRP For monitoring blood sugar levels up to 4 times a day. May substitute to any manufacturer covered by patient's insurance.    [DISCONTINUED] Lancet Device MISC For monitoring blood sugar up to 4 times a day. May substitute to any manufacturer covered by patient's insurance.    [DISCONTINUED] Lancets Misc. MISC For monitoring blood sugar up to 4 times a day. May substitute to any manufacturer covered by patient's insurance.    [DISCONTINUED] ondansetron  (ZOFRAN -ODT) 4 MG disintegrating tablet Take 1 tablet (4 mg total) by mouth every 8 (eight) hours as needed for nausea.    No facility-administered encounter medications on file as of 05/30/2024.   Allergies  Allergen Reactions   Tizanidine  Other (See Comments)    Sick on stomach      ROS: low grade fever.  Chills initially, resolved.  +frontal sinus headaches. Denies shortness of breath, chest pain. No n/v/d or urinary  complaints.    PHYSICAL EXAM:  BP 128/82   Pulse 80   Temp 98.6 F (37 C) (Tympanic)   Ht 5' 8 (1.727 m)   Wt 161 lb 3.2 oz (73.1 kg)   LMP 11/16/2014 (Approximate)   BMI 24.51 kg/m   Pleasant, well-appearing female, in no distress. Rare cough during visit, speaking comfortably. HEENT: conjunctiva and sclera are clear, EOMI. TM's and EACs normal bilaterally Nasal mucosa with mod-severe edema R>L. White  mucus noted in the L nares. Sinuses are nontender. OP is clear, no erythema Neck: no lymphadenopathy or mass Heart: regular rate and rhythm Lungs: clear bilaterally Neuro: alert and oriented, cranial nerves grossly intact, normal gait Psych: normal mood, affect, hygiene and grooming  Influenza A&B, COVID negative   ASSESSMENT/PLAN:  Viral upper respiratory illness - supportive measures reviewed.  Discussed s/sx for which she she contact us  and/or return (s/sx bacterial infections reviewed)  Acute cough - Plan: Influenza A/B, POC COVID-19 BinaxNow  Chest congestion - Plan: Influenza A/B, POC COVID-19 BinaxNow  Stay well hydrated. You can continue the Mucinex fast-max, and add a separate PLAIN Mucinex 12 hour tablet (only ingredient should be guaifenesin)  This is an expectorant, to help loosen up the mucus and phlegm.  You can use a daytime medication to help with your runny nose and congestion (decongestant such as sudafed, if needed, or an antihistamine like chlortrimeton or claritin if you can't tolerate the decongestants).  You may also try sinus rinses (sinus rinse kit ([employer]) or Neti-pot), up to twice daily. This will help with any sinus pressure also.  Contact us  if you have more discolored drainage or sinus pain, so that we can prescribe an antibiotic for a sinus infection (it does not appear that you have a bacterial infection at this time). Please return for re-evaluation if you are having chest pain, high fever, shortness of breath.

## 2024-06-04 ENCOUNTER — Encounter: Payer: Self-pay | Admitting: Family Medicine

## 2024-06-05 MED ORDER — AZITHROMYCIN 250 MG PO TABS: ORAL_TABLET | ORAL | 0 refills | Status: DC

## 2024-06-10 NOTE — Progress Notes (Unsigned)
 Flu: got in oct. Covid: got in oct.  Pneumonia: has not had still a little sick so might not do today Eye exam: last Sept. 2024 [facility] In [place],     Catheline Doing, DNP, AGNP-c [facility]  [address] [place], [place] [address] [phone]   ESTABLISHED PATIENT- Chronic Health and/or Follow-Up Visit on 06/13/2024  Blood pressure 132/84, pulse 80, weight 161 lb 12.8 oz (73.4 kg), last menstrual period 11/16/2014.   Subjective:  Medical Management of Chronic Issues (Med check f/u on diabetes, still has cough from 05/30/24 appt. With Dr. Randol, )   History of Present Illness Helen Walker is a 58 year old female with diabetes who presents for a follow-up visit.  Blood sugars have been well-controlled, with a reading of 103 mg/dL this morning. She has not been on metformin  since October. No increased urination, excessive hunger, or significant swelling. She does experience numbness or tingling in her feet. The pharmacy continues to request her to pick up metformin , which she no longer takes.  She has a persistent cough that began around the Tuesday before Thanksgiving, approximately three weeks ago. The cough started with a sore throat and sinus congestion, progressing to her chest. Sputum is described as greenish but not as severe as initially. She has been taking Mucinex and Sudafed, and was prescribed Azithromax, but the cough persists. No fever, chills, or shortness of breath. Sinuses feel okay, and she is able to sleep despite occasionally waking up coughing. Her husband experienced a similar cough lasting about six weeks.  She participates in a biometric screening for her job, which includes blood pressure monitoring. She is mindful of her diet and continues to monitor her health metrics as part of her wellness incentives at work. Waist measurement 35  ROS negative except for what is listed in HPI. History, Medications, Surgery, SDOH, and  Family History reviewed and updated as appropriate.  Objective:  Physical Exam Vitals and nursing note reviewed.  Constitutional:      General: She is not in acute distress.    Appearance: Normal appearance.  HENT:     Head: Normocephalic.     Mouth/Throat:     Mouth: Mucous membranes are moist.     Pharynx: Oropharynx is clear.  Eyes:     Pupils: Pupils are equal, round, and reactive to light.  Cardiovascular:     Rate and Rhythm: Normal rate and regular rhythm.     Pulses: Normal pulses.     Heart sounds: Normal heart sounds.  Pulmonary:     Effort: Pulmonary effort is normal.     Breath sounds: Normal breath sounds. No wheezing or rhonchi.  Musculoskeletal:        General: Normal range of motion.     Cervical back: Normal range of motion. No tenderness.     Right lower leg: No edema.     Left lower leg: No edema.  Lymphadenopathy:     Cervical: No cervical adenopathy.  Skin:    General: Skin is warm and dry.  Neurological:     Mental Status: She is alert and oriented to person, place, and time.  Psychiatric:        Mood and Affect: Mood normal.         Assessment & Plan:   Assessment & Plan Type 2 diabetes mellitus without complication in remission Type 2 diabetes mellitus is in remission with blood sugar levels well-controlled. Morning blood sugar was 103 mg/dL. No symptoms of  hyperglycemia such as polyuria or polyphagia. Metformin  prescription has been canceled. - Continue monitoring blood sugar levels. - Ensure metformin  prescription is canceled at the pharmacy. Orders:   Hemoglobin A1c   CBC with Differential/Platelet   Comprehensive metabolic panel with GFR   Lipid panel  Hyperlipidemia associated with type 2 diabetes mellitus (HCC) Repeat labs for monitoring today to ensure levels at goal. Accompanied by Diabetes, well controlled.  Orders:   Hemoglobin A1c   CBC with Differential/Platelet   Comprehensive metabolic panel with GFR   Lipid panel  Viral  upper respiratory illness Persistent cough since before Thanksgiving, initially treated with Mucinex, Sudafed, and azithromycin . Cough persists with greenish sputum, but no fever, chills, or shortness of breath. Lungs sound clear, indicating no pneumonia. Cough likely due to post-viral drainage. - Prescribed Tessalon  Perles for cough management. Orders:   benzonatate  (TESSALON ) 200 MG capsule; Take 1 capsule (200 mg total) by mouth 3 (three) times daily as needed for cough.  Need for hepatitis vaccination     Encounter for biometric screening Will complete screening paperwork when sent based on labs and findings. Waist measurement 35. Orders:   Hemoglobin A1c   CBC with Differential/Platelet   Comprehensive metabolic panel with GFR   Lipid panel    Camie FORBES Doing, DNP, AGNP-c

## 2024-06-13 ENCOUNTER — Encounter: Payer: Self-pay | Admitting: Nurse Practitioner

## 2024-06-13 ENCOUNTER — Ambulatory Visit: Payer: Self-pay | Admitting: Nurse Practitioner

## 2024-06-13 VITALS — BP 132/84 | HR 80 | Wt 161.8 lb

## 2024-06-13 DIAGNOSIS — E11A Type 2 diabetes mellitus without complications in remission: Secondary | ICD-10-CM

## 2024-06-13 DIAGNOSIS — E785 Hyperlipidemia, unspecified: Secondary | ICD-10-CM

## 2024-06-13 DIAGNOSIS — E1169 Type 2 diabetes mellitus with other specified complication: Secondary | ICD-10-CM

## 2024-06-13 DIAGNOSIS — J069 Acute upper respiratory infection, unspecified: Secondary | ICD-10-CM

## 2024-06-13 DIAGNOSIS — Z23 Encounter for immunization: Secondary | ICD-10-CM

## 2024-06-13 DIAGNOSIS — Z008 Encounter for other general examination: Secondary | ICD-10-CM

## 2024-06-13 HISTORY — DX: Acute upper respiratory infection, unspecified: J06.9

## 2024-06-13 MED ORDER — BENZONATATE 200 MG PO CAPS: 200.0000 mg | ORAL_CAPSULE | Freq: Three times a day (TID) | ORAL | 0 refills | Status: DC | PRN

## 2024-06-13 NOTE — Assessment & Plan Note (Addendum)
 Repeat labs for monitoring today to ensure levels at goal. Accompanied by Diabetes, well controlled.  Orders:   Hemoglobin A1c   CBC with Differential/Platelet   Comprehensive metabolic panel with GFR   Lipid panel

## 2024-06-13 NOTE — Assessment & Plan Note (Addendum)
 Persistent cough since before Thanksgiving, initially treated with Mucinex, Sudafed, and azithromycin . Cough persists with greenish sputum, but no fever, chills, or shortness of breath. Lungs sound clear, indicating no pneumonia. Cough likely due to post-viral drainage. - Prescribed Tessalon  Perles for cough management. Orders:   benzonatate  (TESSALON ) 200 MG capsule; Take 1 capsule (200 mg total) by mouth 3 (three) times daily as needed for cough.

## 2024-06-13 NOTE — Patient Instructions (Addendum)
 Send me the biometric screening and we can fill that paperwork out for you.   Continue to monitor your diet and work on exercise of at least 20 minutes a day to help with your blood sugars. I will let you know what your labs show.   Diabetes I recommend that you check your blood sugar at least once every morning before eating and write down this number to bring with you to your follow-up visits so we can go over them.  If your blood sugar is higher than 150 fasting for three readings, please let me know so we can adjust your medications.  It is always a good idea to also check your blood pressure in the mornings, as well, as diabetes and high blood pressure can go hand in hand.  Your goal blood pressure is less than 125/85.  If your blood pressure is higher than your goal for three readings in a row, please let me know so we can adjust your medications.  I recommend daily physical activity of at least 20 minutes to help improve your heart health and help your body use the excess sugar. I recommend no more than 1200-1500 calories per day with no more than 150 grams of carbohydrates.

## 2024-06-13 NOTE — Assessment & Plan Note (Addendum)
 Type 2 diabetes mellitus is in remission with blood sugar levels well-controlled. Morning blood sugar was 103 mg/dL. No symptoms of hyperglycemia such as polyuria or polyphagia. Metformin  prescription has been canceled. - Continue monitoring blood sugar levels. - Ensure metformin  prescription is canceled at the pharmacy. Orders:   Hemoglobin A1c   CBC with Differential/Platelet   Comprehensive metabolic panel with GFR   Lipid panel

## 2024-06-14 LAB — LIPID PANEL
Chol/HDL Ratio: 4.6 ratio — ABNORMAL HIGH (ref 0.0–4.4)
Cholesterol, Total: 211 mg/dL — ABNORMAL HIGH (ref 100–199)
HDL: 46 mg/dL (ref >39)
LDL Chol Calc (NIH): 145 mg/dL — ABNORMAL HIGH (ref 0–99)
Triglycerides: 113 mg/dL (ref 0–149)
VLDL Cholesterol Cal: 20 mg/dL (ref 5–40)

## 2024-06-14 LAB — COMPREHENSIVE METABOLIC PANEL WITH GFR
ALT: 13 IU/L (ref 0–32)
AST: 17 IU/L (ref 0–40)
Albumin: 4.4 g/dL (ref 3.8–4.9)
Alkaline Phosphatase: 81 IU/L (ref 49–135)
BUN/Creatinine Ratio: 14 (ref 9–23)
BUN: 13 mg/dL (ref 6–24)
Bilirubin Total: 0.8 mg/dL (ref 0.0–1.2)
CO2: 22 mmol/L (ref 20–29)
Calcium: 9.6 mg/dL (ref 8.7–10.2)
Chloride: 101 mmol/L (ref 96–106)
Creatinine, Ser: 0.93 mg/dL (ref 0.57–1.00)
Globulin, Total: 3 g/dL (ref 1.5–4.5)
Glucose: 78 mg/dL (ref 70–99)
Potassium: 4.5 mmol/L (ref 3.5–5.2)
Sodium: 138 mmol/L (ref 134–144)
Total Protein: 7.4 g/dL (ref 6.0–8.5)
eGFR: 71 mL/min/1.73 (ref >59)

## 2024-06-14 LAB — HEMOGLOBIN A1C
Est. average glucose Bld gHb Est-mCnc: 137 mg/dL
Hgb A1c MFr Bld: 6.4 % — ABNORMAL HIGH (ref 4.8–5.6)

## 2024-06-14 LAB — CBC WITH DIFFERENTIAL/PLATELET
Basophils Absolute: 0.1 x10E3/uL (ref 0.0–0.2)
Basos: 1 %
EOS (ABSOLUTE): 0.1 x10E3/uL (ref 0.0–0.4)
Eos: 1 %
Hematocrit: 40.4 % (ref 34.0–46.6)
Hemoglobin: 13.2 g/dL (ref 11.1–15.9)
Immature Grans (Abs): 0 x10E3/uL (ref 0.0–0.1)
Immature Granulocytes: 0 %
Lymphocytes Absolute: 2.9 x10E3/uL (ref 0.7–3.1)
Lymphs: 52 %
MCH: 28.8 pg (ref 26.6–33.0)
MCHC: 32.7 g/dL (ref 31.5–35.7)
MCV: 88 fL (ref 79–97)
Monocytes Absolute: 0.5 x10E3/uL (ref 0.1–0.9)
Monocytes: 9 %
Neutrophils Absolute: 2.1 x10E3/uL (ref 1.4–7.0)
Neutrophils: 37 %
Platelets: 403 x10E3/uL (ref 150–450)
RBC: 4.59 x10E6/uL (ref 3.77–5.28)
RDW: 12.9 % (ref 11.7–15.4)
WBC: 5.6 x10E3/uL (ref 3.4–10.8)

## 2024-06-15 ENCOUNTER — Ambulatory Visit: Payer: Self-pay | Admitting: Nurse Practitioner

## 2024-07-25 ENCOUNTER — Other Ambulatory Visit (HOSPITAL_COMMUNITY): Payer: Self-pay

## 2024-07-27 ENCOUNTER — Other Ambulatory Visit: Payer: Self-pay

## 2024-07-27 ENCOUNTER — Telehealth: Payer: Self-pay

## 2024-07-27 DIAGNOSIS — M62838 Other muscle spasm: Secondary | ICD-10-CM

## 2024-07-27 MED ORDER — CYCLOBENZAPRINE HCL 5 MG PO TABS: 5.0000 mg | ORAL_TABLET | Freq: Every evening | ORAL | 1 refills | Status: AC | PRN

## 2024-07-27 NOTE — Telephone Encounter (Signed)
 Copied from CRM [id number]. Topic: Clinical - Medication Question >> Jul 27, 2024  9:59 AM Helen Walker wrote: Reason for CRM: Patient is calling in because she fell yesterday and wants to know if she can have a muscle relaxer sent in. Please advise.

## 2024-08-03 ENCOUNTER — Ambulatory Visit: Payer: BC Managed Care – PPO | Admitting: Nurse Practitioner

## 2024-08-03 ENCOUNTER — Encounter: Payer: Self-pay | Admitting: Nurse Practitioner

## 2024-08-03 VITALS — BP 122/78 | HR 68 | Ht 68.0 in | Wt 167.6 lb

## 2024-08-03 DIAGNOSIS — Z0000 Encounter for general adult medical examination without abnormal findings: Secondary | ICD-10-CM | POA: Diagnosis not present

## 2024-08-03 DIAGNOSIS — E1169 Type 2 diabetes mellitus with other specified complication: Secondary | ICD-10-CM | POA: Diagnosis not present

## 2024-08-03 DIAGNOSIS — W009XXA Unspecified fall due to ice and snow, initial encounter: Secondary | ICD-10-CM

## 2024-08-03 DIAGNOSIS — E785 Hyperlipidemia, unspecified: Secondary | ICD-10-CM | POA: Diagnosis not present

## 2024-08-03 DIAGNOSIS — R42 Dizziness and giddiness: Secondary | ICD-10-CM

## 2024-08-03 MED ORDER — TRAMADOL HCL 50 MG PO TABS: 50.0000 mg | ORAL_TABLET | Freq: Four times a day (QID) | ORAL | 0 refills | Status: AC | PRN

## 2024-08-03 MED ORDER — MECLIZINE HCL 25 MG PO TABS: 25.0000 mg | ORAL_TABLET | Freq: Three times a day (TID) | ORAL | 1 refills | Status: AC | PRN

## 2024-08-03 NOTE — Assessment & Plan Note (Addendum)

## 2024-08-03 NOTE — Patient Instructions (Addendum)
 Recommendations for your health: Diabetic Eye Exam We will request the records from [facility] to update your chart.   Labs Today: I review all results once they have come in and will send you a MyChart message or Juliene will call with any recommendations.  This process can sometimes take time if additional tests need to be added.  I am happy to answer any questions you may have about the labs, but extensive questions or concerns may require a visit.  Please allow me to send explanations and recommendations before reaching out, if possible.    I put information on the low back pain and exercises on the back of the paperwork.  This will take about 4 weeks to really start to resolve. Go slow and stretch gently.  Use heat, stretching, and alternate ibuprofen  and tylenol  (or use the dual action formula) to help with the pain and inflammation. I sent in Tramadol  for your to also help with the pain.    For all adult patients, I recommend A well balanced diet low in saturated fats, cholesterol, and moderation in carbohydrates.   This can be as simple as monitoring portion sizes and cutting back on sugary beverages such as soda and juice to start with.    Daily water consumption of at least 64 ounces.  Physical activity at least 180 minutes per week, if just starting out.   This can be as simple as taking the stairs instead of the elevator and walking 2-3 laps around the office  purposefully every day.   STD protection, partner selection, and regular testing if high risk.  Limited consumption of alcoholic beverages if alcohol is consumed.  For women, I recommend no more than 7 alcoholic beverages per week, spread out throughout the week.  Avoid binge drinking or consuming large quantities of alcohol in one setting.   Please let me know if you feel you may need help with reduction or quitting alcohol consumption.   Avoidance of nicotine, if used.  Please let me know if you feel you may need  help with reduction or quitting nicotine use.   Daily mental health attention.  This can be in the form of 5 minute daily meditation, prayer, journaling, yoga, reflection, etc.   Purposeful attention to your emotions and mental state can significantly improve your overall wellbeing  and Health.  Please know that I am here to help you with all of your health care goals and am happy to work with you to find a solution that works best for you.  The greatest advice I have received with any changes in life are to take it one step at a time, that even means if all you can focus on is the next 60 seconds, then do that and celebrate your victories.  With any changes in life, you will have set backs, and that is OK. The important thing to remember is, if you have a set back, it is not a failure, it is an opportunity to try again!  Health Maintenance Recommendations Screening Testing Mammogram Every 1 -2 years based on history and risk factors Starting at age 54 Pap Smear Ages 21-39 every 3 years Ages 44-65 every 5 years with HPV testing More frequent testing may be required based on results and history Colon Cancer Screening Every 1-10 years based on test performed, risk factors, and history Starting at age 21 Bone Density Screening Every 2-10 years based on history Starting at age 61 for women Recommendations for men  differ based on medication usage, history, and risk factors AAA Screening One time ultrasound Men 65-30 years old who have every smoked Lung Cancer Screening Low Dose Lung CT every 12 months Age 32-80 years with a 30 pack-year smoking history who still smoke or who have quit within the last 15 years  Screening Labs Routine  Labs: Complete Blood Count (CBC), Complete Metabolic Panel (CMP), Cholesterol (Lipid Panel) Every 6-12 months based on history and medications May be recommended more frequently based on current conditions or previous results Hemoglobin A1c Lab Every  3-12 months based on history and previous results Starting at age 24 or earlier with diagnosis of diabetes, high cholesterol, BMI >26, and/or risk factors Frequent monitoring for patients with diabetes to ensure blood sugar control Thyroid  Panel (TSH w/ T3 & T4) Every 6 months based on history, symptoms, and risk factors May be repeated more often if on medication HIV One time testing for all patients 73 and older May be repeated more frequently for patients with increased risk factors or exposure Hepatitis C One time testing for all patients 88 and older May be repeated more frequently for patients with increased risk factors or exposure Gonorrhea, Chlamydia Every 12 months for all sexually active persons 13-24 years Additional monitoring may be recommended for those who are considered high risk or who have symptoms PSA Men 28-49 years old with risk factors Additional screening may be recommended from age 88-69 based on risk factors, symptoms, and history  Vaccine Recommendations Tetanus Booster All adults every 10 years Flu Vaccine All patients 6 months and older every year COVID Vaccine All patients 12 years and older Initial dosing with booster May recommend additional booster based on age and health history HPV Vaccine 2 doses all patients age 7-26 Dosing may be considered for patients over 26 Shingles Vaccine (Shingrix ) 2 doses all adults 55 years and older Pneumonia (Pneumovax 23) All adults 65 years and older May recommend earlier dosing based on health history Pneumonia (Prevnar 13) All adults 65 years and older Dosed 1 year after Pneumovax 23  Additional Screening, Testing, and Vaccinations may be recommended on an individualized basis based on family history, health history, risk factors, and/or exposure.

## 2024-08-03 NOTE — Assessment & Plan Note (Addendum)
 Diet and exercise managed. Fasting labs today for evaluation and management. Information on diet recommendations provided on AVS.  Orders:   CBC with Differential/Platelet   CMP14+EGFR   Hemoglobin A1c   Lipid panel   Microalbumin/Creatinine Ratio, Urine

## 2024-08-03 NOTE — Assessment & Plan Note (Deleted)
 SABRA

## 2024-08-03 NOTE — Assessment & Plan Note (Addendum)
 Curently managed with diet and exercise. Recent paresthesias in the feet at bedtime, not likely related to blood sugar given her history of control, but we will check this and electrolytes today. Continue current management.  Orders:   CBC with Differential/Platelet   CMP14+EGFR   Hemoglobin A1c   Lipid panel   Microalbumin/Creatinine Ratio, Urine

## 2024-08-04 LAB — CBC WITH DIFFERENTIAL/PLATELET
Basophils Absolute: 0.1 10*3/uL (ref 0.0–0.2)
Basos: 1 %
EOS (ABSOLUTE): 0.1 10*3/uL (ref 0.0–0.4)
Eos: 1 %
Hematocrit: 39.1 % (ref 34.0–46.6)
Hemoglobin: 12.8 g/dL (ref 11.1–15.9)
Immature Grans (Abs): 0 10*3/uL (ref 0.0–0.1)
Immature Granulocytes: 0 %
Lymphocytes Absolute: 2.7 10*3/uL (ref 0.7–3.1)
Lymphs: 50 %
MCH: 29 pg (ref 26.6–33.0)
MCHC: 32.7 g/dL (ref 31.5–35.7)
MCV: 89 fL (ref 79–97)
Monocytes Absolute: 0.4 10*3/uL (ref 0.1–0.9)
Monocytes: 7 %
Neutrophils Absolute: 2.3 10*3/uL (ref 1.4–7.0)
Neutrophils: 41 %
Platelets: 331 10*3/uL (ref 150–450)
RBC: 4.42 x10E6/uL (ref 3.77–5.28)
RDW: 12.9 % (ref 11.7–15.4)
WBC: 5.5 10*3/uL (ref 3.4–10.8)

## 2024-08-04 LAB — LIPID PANEL
Chol/HDL Ratio: 5 ratio — ABNORMAL HIGH (ref 0.0–4.4)
Cholesterol, Total: 239 mg/dL — ABNORMAL HIGH (ref 100–199)
HDL: 48 mg/dL
LDL Chol Calc (NIH): 171 mg/dL — ABNORMAL HIGH (ref 0–99)
Triglycerides: 110 mg/dL (ref 0–149)
VLDL Cholesterol Cal: 20 mg/dL (ref 5–40)

## 2024-08-04 LAB — HEMOGLOBIN A1C
Est. average glucose Bld gHb Est-mCnc: 146 mg/dL
Hgb A1c MFr Bld: 6.7 % — ABNORMAL HIGH (ref 4.8–5.6)

## 2024-08-04 LAB — MICROALBUMIN / CREATININE URINE RATIO
Creatinine, Urine: 114.4 mg/dL
Microalb/Creat Ratio: 5 mg/g{creat} (ref 0–29)
Microalbumin, Urine: 5.2 ug/mL

## 2024-08-04 LAB — CMP14+EGFR
ALT: 17 [IU]/L (ref 0–32)
AST: 14 [IU]/L (ref 0–40)
Albumin: 4.5 g/dL (ref 3.8–4.9)
Alkaline Phosphatase: 80 [IU]/L (ref 49–135)
BUN/Creatinine Ratio: 22 (ref 9–23)
BUN: 19 mg/dL (ref 6–24)
Bilirubin Total: 0.7 mg/dL (ref 0.0–1.2)
CO2: 22 mmol/L (ref 20–29)
Calcium: 9.7 mg/dL (ref 8.7–10.2)
Chloride: 104 mmol/L (ref 96–106)
Creatinine, Ser: 0.86 mg/dL (ref 0.57–1.00)
Globulin, Total: 2.9 g/dL (ref 1.5–4.5)
Glucose: 105 mg/dL — ABNORMAL HIGH (ref 70–99)
Potassium: 4.5 mmol/L (ref 3.5–5.2)
Sodium: 140 mmol/L (ref 134–144)
Total Protein: 7.4 g/dL (ref 6.0–8.5)
eGFR: 78 mL/min/{1.73_m2}

## 2024-08-09 ENCOUNTER — Ambulatory Visit: Admitting: Dermatology

## 2025-01-31 ENCOUNTER — Ambulatory Visit: Admitting: Nurse Practitioner

## 2025-02-01 ENCOUNTER — Ambulatory Visit: Admitting: Nurse Practitioner

## 2025-08-08 ENCOUNTER — Encounter: Admitting: Nurse Practitioner
